# Patient Record
Sex: Female | Born: 1935 | Race: White | Hispanic: No | Smoking: Former smoker
Health system: Southern US, Community
[De-identification: ages and names within clinical notes are randomized; demographics above are authoritative.]

## PROBLEM LIST (undated history)

## (undated) DIAGNOSIS — I4891 Unspecified atrial fibrillation: Secondary | ICD-10-CM

## (undated) DIAGNOSIS — C50919 Malignant neoplasm of unspecified site of unspecified female breast: Secondary | ICD-10-CM

## (undated) DIAGNOSIS — J449 Chronic obstructive pulmonary disease, unspecified: Secondary | ICD-10-CM

## (undated) DIAGNOSIS — I219 Acute myocardial infarction, unspecified: Secondary | ICD-10-CM

## (undated) HISTORY — DX: Chronic obstructive pulmonary disease, unspecified: J44.9

## (undated) HISTORY — PX: BREAST SURGERY: SHX581

## (undated) HISTORY — DX: Acute myocardial infarction, unspecified: I21.9

## (undated) HISTORY — PX: ANGIOPLASTY: SHX39

## (undated) HISTORY — DX: Malignant neoplasm of unspecified site of unspecified female breast: C50.919

## (undated) HISTORY — PX: VAGINAL HYSTERECTOMY: SUR661

---

## 2004-09-29 ENCOUNTER — Ambulatory Visit: Payer: Self-pay | Admitting: Internal Medicine

## 2004-10-12 ENCOUNTER — Ambulatory Visit: Payer: Self-pay | Admitting: Internal Medicine

## 2004-11-05 ENCOUNTER — Ambulatory Visit: Payer: Self-pay | Admitting: General Surgery

## 2004-11-06 ENCOUNTER — Ambulatory Visit: Payer: Self-pay | Admitting: General Surgery

## 2004-11-13 ENCOUNTER — Ambulatory Visit: Payer: Self-pay | Admitting: General Surgery

## 2004-11-27 ENCOUNTER — Ambulatory Visit: Payer: Self-pay | Admitting: Oncology

## 2004-12-06 ENCOUNTER — Ambulatory Visit: Payer: Self-pay | Admitting: Oncology

## 2005-01-06 ENCOUNTER — Ambulatory Visit: Payer: Self-pay | Admitting: Oncology

## 2005-02-03 ENCOUNTER — Ambulatory Visit: Payer: Self-pay | Admitting: Oncology

## 2005-03-06 ENCOUNTER — Ambulatory Visit: Payer: Self-pay | Admitting: Oncology

## 2005-04-07 ENCOUNTER — Ambulatory Visit: Payer: Self-pay | Admitting: Oncology

## 2005-07-08 ENCOUNTER — Ambulatory Visit: Payer: Self-pay | Admitting: Oncology

## 2005-11-01 ENCOUNTER — Ambulatory Visit: Payer: Self-pay | Admitting: General Surgery

## 2006-01-04 ENCOUNTER — Ambulatory Visit: Payer: Self-pay | Admitting: Oncology

## 2006-09-22 ENCOUNTER — Ambulatory Visit: Payer: Self-pay | Admitting: Internal Medicine

## 2006-11-07 ENCOUNTER — Ambulatory Visit: Payer: Self-pay | Admitting: General Surgery

## 2006-11-09 ENCOUNTER — Ambulatory Visit: Payer: Self-pay | Admitting: General Surgery

## 2007-01-09 ENCOUNTER — Ambulatory Visit: Payer: Self-pay | Admitting: Oncology

## 2007-08-07 ENCOUNTER — Ambulatory Visit: Payer: Self-pay | Admitting: Oncology

## 2007-08-16 ENCOUNTER — Ambulatory Visit: Payer: Self-pay | Admitting: Oncology

## 2007-09-06 ENCOUNTER — Ambulatory Visit: Payer: Self-pay | Admitting: Oncology

## 2007-12-14 ENCOUNTER — Ambulatory Visit: Payer: Self-pay | Admitting: Oncology

## 2008-01-07 ENCOUNTER — Ambulatory Visit: Payer: Self-pay | Admitting: Radiation Oncology

## 2008-01-18 ENCOUNTER — Ambulatory Visit: Payer: Self-pay | Admitting: Radiation Oncology

## 2008-02-04 ENCOUNTER — Ambulatory Visit: Payer: Self-pay | Admitting: Radiation Oncology

## 2008-02-04 ENCOUNTER — Ambulatory Visit: Payer: Self-pay | Admitting: Oncology

## 2008-02-23 ENCOUNTER — Ambulatory Visit: Payer: Self-pay | Admitting: Family Medicine

## 2008-03-06 ENCOUNTER — Ambulatory Visit: Payer: Self-pay | Admitting: Radiation Oncology

## 2008-03-06 ENCOUNTER — Ambulatory Visit: Payer: Self-pay | Admitting: Oncology

## 2008-04-05 ENCOUNTER — Ambulatory Visit: Payer: Self-pay | Admitting: Oncology

## 2008-08-15 ENCOUNTER — Ambulatory Visit: Payer: Self-pay | Admitting: Oncology

## 2008-09-05 ENCOUNTER — Ambulatory Visit: Payer: Self-pay | Admitting: Oncology

## 2009-03-06 ENCOUNTER — Ambulatory Visit: Payer: Self-pay | Admitting: Oncology

## 2009-03-13 ENCOUNTER — Ambulatory Visit: Payer: Self-pay | Admitting: Oncology

## 2009-04-05 ENCOUNTER — Ambulatory Visit: Payer: Self-pay | Admitting: Oncology

## 2010-03-06 ENCOUNTER — Ambulatory Visit: Payer: Self-pay | Admitting: Radiation Oncology

## 2010-03-13 ENCOUNTER — Ambulatory Visit: Payer: Self-pay | Admitting: Radiation Oncology

## 2010-04-03 ENCOUNTER — Ambulatory Visit: Payer: Self-pay | Admitting: Internal Medicine

## 2010-04-05 ENCOUNTER — Ambulatory Visit: Payer: Self-pay | Admitting: Radiation Oncology

## 2010-04-06 ENCOUNTER — Ambulatory Visit: Payer: Self-pay | Admitting: Oncology

## 2010-05-06 ENCOUNTER — Ambulatory Visit: Payer: Self-pay | Admitting: Radiation Oncology

## 2010-10-06 ENCOUNTER — Ambulatory Visit: Payer: Self-pay | Admitting: Oncology

## 2010-10-07 LAB — CANCER ANTIGEN 27.29: CA 27.29: 38 U/mL (ref 0.0–38.6)

## 2010-11-05 ENCOUNTER — Ambulatory Visit: Payer: Self-pay | Admitting: Oncology

## 2011-12-25 ENCOUNTER — Emergency Department: Payer: Self-pay | Admitting: Emergency Medicine

## 2011-12-25 LAB — COMPREHENSIVE METABOLIC PANEL
Albumin: 3.5 g/dL (ref 3.4–5.0)
Alkaline Phosphatase: 61 U/L (ref 50–136)
Anion Gap: 8 (ref 7–16)
BUN: 20 mg/dL — ABNORMAL HIGH (ref 7–18)
Bilirubin,Total: 0.5 mg/dL (ref 0.2–1.0)
Chloride: 106 mmol/L (ref 98–107)
Creatinine: 0.75 mg/dL (ref 0.60–1.30)
Glucose: 124 mg/dL — ABNORMAL HIGH (ref 65–99)
SGOT(AST): 60 U/L — ABNORMAL HIGH (ref 15–37)
SGPT (ALT): 45 U/L
Sodium: 144 mmol/L (ref 136–145)
Total Protein: 7.3 g/dL (ref 6.4–8.2)

## 2011-12-25 LAB — CBC WITH DIFFERENTIAL/PLATELET
Basophil #: 0 10*3/uL (ref 0.0–0.1)
Basophil %: 0.7 %
HCT: 44.6 % (ref 35.0–47.0)
HGB: 15.1 g/dL (ref 12.0–16.0)
Lymphocyte %: 30.1 %
MCH: 33.1 pg (ref 26.0–34.0)
MCV: 98 fL (ref 80–100)
Monocyte %: 7.8 %
Neutrophil #: 4.1 10*3/uL (ref 1.4–6.5)
WBC: 6.7 10*3/uL (ref 3.6–11.0)

## 2011-12-25 LAB — PROTIME-INR
INR: 0.9
Prothrombin Time: 13 secs (ref 11.5–14.7)

## 2011-12-25 LAB — CK-MB: CK-MB: 6.4 ng/mL — ABNORMAL HIGH (ref 0.5–3.6)

## 2011-12-25 LAB — TROPONIN I: Troponin-I: 0.73 ng/mL — ABNORMAL HIGH

## 2014-03-15 LAB — BASIC METABOLIC PANEL
Anion Gap: 0 — ABNORMAL LOW (ref 7–16)
BUN: 29 mg/dL — AB (ref 7–18)
CREATININE: 1.23 mg/dL (ref 0.60–1.30)
Calcium, Total: 9.1 mg/dL (ref 8.5–10.1)
Chloride: 106 mmol/L (ref 98–107)
Co2: 35 mmol/L — ABNORMAL HIGH (ref 21–32)
EGFR (Non-African Amer.): 39 — ABNORMAL LOW
GFR CALC AF AMER: 46 — AB
Glucose: 104 mg/dL — ABNORMAL HIGH (ref 65–99)
OSMOLALITY: 287 (ref 275–301)
Potassium: 4.5 mmol/L (ref 3.5–5.1)
Sodium: 141 mmol/L (ref 136–145)

## 2014-03-15 LAB — CBC WITH DIFFERENTIAL/PLATELET
BASOS ABS: 0.1 10*3/uL (ref 0.0–0.1)
BASOS PCT: 1 %
Eosinophil #: 0.1 10*3/uL (ref 0.0–0.7)
Eosinophil %: 1.9 %
HCT: 47.8 % — ABNORMAL HIGH (ref 35.0–47.0)
HGB: 15 g/dL (ref 12.0–16.0)
LYMPHS ABS: 1.4 10*3/uL (ref 1.0–3.6)
Lymphocyte %: 20.3 %
MCH: 30.7 pg (ref 26.0–34.0)
MCHC: 31.4 g/dL — ABNORMAL LOW (ref 32.0–36.0)
MCV: 98 fL (ref 80–100)
MONO ABS: 0.8 x10 3/mm (ref 0.2–0.9)
Monocyte %: 11.9 %
Neutrophil #: 4.3 10*3/uL (ref 1.4–6.5)
Neutrophil %: 64.9 %
PLATELETS: 150 10*3/uL (ref 150–440)
RBC: 4.88 10*6/uL (ref 3.80–5.20)
RDW: 15.5 % — ABNORMAL HIGH (ref 11.5–14.5)
WBC: 6.7 10*3/uL (ref 3.6–11.0)

## 2014-03-15 LAB — TROPONIN I: Troponin-I: 0.26 ng/mL — ABNORMAL HIGH

## 2014-03-16 ENCOUNTER — Observation Stay: Payer: Self-pay | Admitting: Internal Medicine

## 2014-03-16 LAB — URINALYSIS, COMPLETE
Bacteria: NONE SEEN
Bilirubin,UR: NEGATIVE
Blood: NEGATIVE
GLUCOSE, UR: NEGATIVE mg/dL (ref 0–75)
Ketone: NEGATIVE
NITRITE: NEGATIVE
PH: 7 (ref 4.5–8.0)
PROTEIN: NEGATIVE
RBC,UR: 5 /HPF (ref 0–5)
Specific Gravity: 1.019 (ref 1.003–1.030)
Squamous Epithelial: 1
WBC UR: 8 /HPF (ref 0–5)

## 2014-03-16 LAB — TROPONIN I
TROPONIN-I: 0.26 ng/mL — AB
Troponin-I: 0.24 ng/mL — ABNORMAL HIGH

## 2014-03-16 LAB — CK-MB
CK-MB: 2.4 ng/mL (ref 0.5–3.6)
CK-MB: 2.4 ng/mL (ref 0.5–3.6)

## 2014-04-01 ENCOUNTER — Ambulatory Visit (INDEPENDENT_AMBULATORY_CARE_PROVIDER_SITE_OTHER): Payer: Medicare Other | Admitting: Podiatry

## 2014-04-01 ENCOUNTER — Encounter: Payer: Self-pay | Admitting: Podiatry

## 2014-04-01 VITALS — Resp 16 | Ht 64.0 in | Wt 129.0 lb

## 2014-04-01 DIAGNOSIS — B351 Tinea unguium: Secondary | ICD-10-CM

## 2014-04-01 DIAGNOSIS — M79609 Pain in unspecified limb: Secondary | ICD-10-CM

## 2014-04-01 NOTE — Progress Notes (Signed)
   Subjective:    Patient ID: Alexandria Roberts, female    DOB: 1924-02-26, 80 y.o.   MRN: [id number]  HPI Comments: She needs her toenails clipped. They do not hurt. They are thick and discolored. i trim my toenails.     Review of Systems  HENT:       Hearing loss Ear pain  Respiratory: Positive for cough, shortness of breath and wheezing.        Difficulty breathing  Musculoskeletal:       Difficulty walking  Skin:       Open sores  Neurological: Positive for dizziness, weakness and light-headedness.  Hematological: Bruises/bleeds easily.  Psychiatric/Behavioral: Positive for confusion. The patient is nervous/anxious.   All other systems reviewed and are negative.      Objective:   Physical Exam: I have reviewed her past history medications allergies surgeries social history and review of systems. Pulses remain barely palpable +1 over +4 dorsalis pedis pulse bilateral. Positive pitting edema to the bilateral lower extremity ulceration superficial left leg. Mild erythema to the foot. Neurologic sensorium appears to be intact. Deep tendon reflexes are intact and brisk bilateral. Neurologic sensorium slightly decreased per since once the monofilament. Muscle strength is 4/5 dorsiflexors plantar flexors inverters and evertors. Cutaneous evaluation demonstrates not only the increase in edema and pitting edema and superficial ulceration to the do not appear to be clinically infected she also thick yellow dystrophic with mycotic nails which are painful palpation. No open lesions to the plantar foot bilateral.        Assessment & Plan:  Assessment: Is pain in limb secondary to onychomycosis 1 through 5 bilateral.  Plan: Debridement of nails 1 through 5 bilateral covered service secondary to pain.

## 2014-07-01 ENCOUNTER — Ambulatory Visit: Payer: Medicare Other | Admitting: Podiatry

## 2014-07-05 ENCOUNTER — Ambulatory Visit: Payer: Medicare Other | Admitting: Podiatry

## 2014-12-03 ENCOUNTER — Encounter: Payer: Self-pay | Admitting: General Surgery

## 2014-12-06 ENCOUNTER — Encounter: Payer: Self-pay | Admitting: General Surgery

## 2014-12-23 ENCOUNTER — Ambulatory Visit (INDEPENDENT_AMBULATORY_CARE_PROVIDER_SITE_OTHER): Payer: Medicare Other | Admitting: Podiatry

## 2014-12-23 DIAGNOSIS — M79606 Pain in leg, unspecified: Secondary | ICD-10-CM

## 2014-12-23 DIAGNOSIS — B351 Tinea unguium: Secondary | ICD-10-CM

## 2014-12-23 NOTE — Progress Notes (Signed)
He presents today with chief complaint of painful elongated toenails 1 through 5 bilateral.  Objective: Vital signs are stable alert and oriented 3. Nails thick yellow dystrophic, mycotic and painful palpation.  Assessment: Pain in limb secondary onychomycosis 1 through 5 bilateral.  Plan: Debridement of nails 1 through 5 bilateral.

## 2014-12-25 LAB — WOUND AEROBIC CULTURE

## 2015-01-06 ENCOUNTER — Encounter: Payer: Self-pay | Admitting: General Surgery

## 2015-02-04 ENCOUNTER — Encounter
Admit: 2015-02-04 | Disposition: A | Discharge status: Home / Self Care | Payer: Self-pay | Attending: Cardiothoracic Surgery | Admitting: Cardiothoracic Surgery

## 2015-02-16 ENCOUNTER — Emergency Department: Payer: Self-pay | Admitting: Emergency Medicine

## 2015-03-07 ENCOUNTER — Encounter
Admit: 2015-03-07 | Disposition: A | Discharge status: Home / Self Care | Payer: Self-pay | Attending: Cardiothoracic Surgery | Admitting: Cardiothoracic Surgery

## 2015-03-24 ENCOUNTER — Ambulatory Visit (INDEPENDENT_AMBULATORY_CARE_PROVIDER_SITE_OTHER): Payer: Medicare Other | Admitting: Podiatry

## 2015-03-24 DIAGNOSIS — M79606 Pain in leg, unspecified: Secondary | ICD-10-CM | POA: Diagnosis not present

## 2015-03-24 DIAGNOSIS — B351 Tinea unguium: Secondary | ICD-10-CM

## 2015-03-24 NOTE — Progress Notes (Signed)
He presents today with chief complaint of painful elongated toenails 1 through 5 bilateral.  Objective: Vital signs are stable alert and oriented 3. Nails thick yellow dystrophic, mycotic and painful palpation.  Assessment: Pain in limb secondary onychomycosis 1 through 5 bilateral.  Plan: Debridement of nails 1 through 5 bilateral.

## 2015-03-29 NOTE — H&P (Signed)
PATIENT NAME:  Alexandria Roberts, Alexandria Roberts MR#:  [id number] DATE OF BIRTH:  11/07/1924  DATE OF ADMISSION:  03/15/2014  REFERRING PHYSICIAN:  Dr. Marjean Donna.   PRIMARY CARE PHYSICIAN:  Dr. Nicky Pugh.   CHIEF COMPLAINT:  Fall.   HISTORY OF PRESENT ILLNESS:  This is an 81 year old female with known past medical history of coronary artery disease, A. Fib, dementia, who lives at home by herself, son and daughter live next to her, help to take care of her, the patient presents with fall, it was unwitnessed fall, the patient has dementia, extremely poor historian.  She denies any loss of consciousness or dizziness, but she is known to have history of multiple falls in the past total more than eight over the last year due to unsteady balance, the patient's has dementia, extremely poor historian, but she did call her daughter after the fall, the patient reports she lost her balance.  The patient had basic work-up done in the ED, where she had cardiac enzymes done, where it was positive at 0.26, only troponin we have in the past on this patient was done two years ago where it was positive as well as at 0.73, she had cardiac cath done at [facility] then, where she was found to have coronary artery disease where she with recommendation of medical management with no stent insertion, the patient's CT head still pending, she denies any focal deficits, any tingling, any numbness, any slurred speech, any altered mental status.  As well, the patient had mild urinary tract infection.  She denies any cough, any productive sputum.   PAST MEDICAL HISTORY: 1.  Coronary artery disease.  2.  COPD.  3.  Breast cancer.  4.  Peptic ulcer disease.  5.  Osteoarthritis.  6.  A. Fib, not on anticoagulation.   PAST SURGICAL HISTORY: 1.  Hysterectomy.  2.  Hemorrhoidectomy.  3.  Left breast lumpectomy, left axillary node dissection in November 2005.   SOCIAL HISTORY:  The patient lives at home by herself.  She still smokes up to  half pack per day.  No alcohol.  No illicit drug use.  Family helps to take care of her.  Daughter lives next to her.   FAMILY HISTORY:  No family history of coronary artery disease at a young age.   ALLERGIES:  CODEINE.   HOME MEDICATIONS: 1.  Aspirin 81 mg oral daily.  2.  Lasix 20 mg oral daily.  3.  Keflex daily.  4.  Coreg oral, currently dose is unknown.  5.  Arimidex 1 mg oral daily.   REVIEW OF SYSTEMS: CONSTITUTIONAL:  The patient has dementia, mildly confused, but able to provide the review of systems.  She denies any fever, any chills, any fatigue, any weakness.  EYES:  Denies blurry vision, double vision, inflammation, glaucoma.  EARS, NOSE, THROAT:  Denies tinnitus, ear pain, hearing loss, epistaxis or discharge.  RESPIRATORY:  Denies cough, wheezing, hemoptysis, dyspnea.  Has history of COPD.  CARDIOVASCULAR:  Denies any chest pain, edema, palpitation, syncope.  Reports history of fall due to unsteady gait.  GASTROINTESTINAL:  Denies nausea, vomiting, diarrhea, abdominal pain, hematemesis, melena.  GENITOURINARY:  Denies dysuria, hematuria.  Family reporting polyuria and urinary incontinence.  ENDOCRINE:  Denies polyuria, polydipsia, heat or cold intolerance.  HEMATOLOGY:  Denies anemia, has easy bruising.  No bleeding diathesis. INTEGUMENTARY:  Denies acne, rash or skin lesion.  MUSCULOSKELETAL:  Denies any cramps, gout.  Reports history of arthritis.  NEUROLOGIC:  No history  of CVA, TIA, has history of dementia.  No focal deficits.  PSYCHIATRIC:  Denies anxiety, insomnia or depression.   PHYSICAL EXAMINATION: VITAL SIGNS:  Temperature 98.6, pulse 75, respiratory rate 16, blood pressure 92/68, saturating 99% on room air.  GENERAL:  Elderly female who looks comfortable in bed, in no apparent distress.  HEENT:  Head atraumatic, normocephalic.  Pupils equal, reactive to light.  Pink conjunctivae.  Anicteric sclerae.  Moist oral mucosa.  NECK:  Supple.  No thyromegaly.  No  JVD.  CHEST:  Good air entry bilaterally.  No wheezing, rales, rhonchi.  CARDIOVASCULAR:  S1, S2 heard.  No rubs, murmurs or gallops, irregularly irregular.  ABDOMEN:  Soft, nontender, nondistended.  Bowel sounds present.  EXTREMITIES:  Has edema +1 on the right, +2 on the left with chronic lower extremity discoloration as well has multiple skin wounds and ulcerations, mainly in the left lower extremity and oozing as well.  Radial and pedal pulses felt bilaterally.  PSYCHIATRIC:  Appropriate affect.  The patient appears to be confused, but pleasant, conversant.  She is aware of her name, however she is in the hospital, does not know which hospital, does not know the date.  NEUROLOGIC:  Cranial nerves grossly intact.  Motor five out of five.  extremities without significant deficits.  SKIN:  Has skin ulcers and wounds in the left lower extremity; as well has left ear injury from her fall with dried blood in the superior area.  MUSCULOSKELETAL:  No joint effusion or erythema.   PERTINENT LABORATORY DATA:  Glucose 104, BUN 29, creatinine 1.23, sodium 141, potassium 4.5, chloride 106, CO2 35.  Troponin 0.26.  White blood cells 6.7, hemoglobin 15, hematocrit 47.8, platelets 150.  Urinalysis, + 1 leukocyte esterase and 8 white blood cells.   ASSESSMENT AND PLAN: 1.  Fall, the patient is known to have history of multiple falls, unclear if this is related to presyncopal episodes as she is a very poor historian, but she denies any loss of consciousness, has multiple falls due to unsteady gait, we will consult physical therapy, we will check CT of the head, we will check echocardiogram.  We will monitor her on telemetry.  As well, she has urinary tract infection, might be contributing to this as well.  2.  Elevated troponin.  She denies any chest pain, any shortness of breath, but she is known to have history of carotid artery disease.  We will consult cardiology service.  We will obtain echocardiogram.  We will  admit her to telemetry, continue to cycle the enzymes.  We will give her one time dose 325 mg of aspirin and one dose of Lovenox treatment dose 1 mg/kg until we cycle her cardiac enzymes and until she is seen by cardiology.  3.  Atrial fibrillation, appears to be rate controlled at this point.  She is not an anticoagulation candidate due to her multiple falls.  We will hold her Coreg at this point due to her soft blood pressure.   4.  Tobacco abuse.  The patient was counseled.  5.  History of coronary artery disease.  Continue with aspirin.  Denies chest pain.  Denies shortness of breath.  6.  History of breast cancer.  Continue with Arimidex.  7.  Urinary tract infection.  Continue with Rocephin.  8.  History of dementia.  We will consult case management to see if she qualifies for any home care, displacement issue was discussed with the family and the patient is adamant  about not going to a nursing home and family are respecting her wishes.  So, so far we will consult case management to see if she qualifies for home care.  As well, they are doubtful if the patient will accept any home care, but they request [place] to try.  9. .  THE PATIENT DOES HAVE A LIVING WILL, DOES NOT HAVE HEALTHCARE POWER OF ATTORNEY AND SHE IS A DO NOT RESUSCITATE.  THIS WAS CONFIRMED BY THE FAMILY, DAUGHTER AND SON AT BEDSIDE.   Total time spent on admission and patient care 55 minutes.     ____________________________ Albertine Patricia, MD dse:ea D: 03/16/2014 02:01:29 ET T: 03/16/2014 03:20:03 ET JOB#: [id number]  cc: Albertine Patricia, MD, <Dictator> [place] Marian Graciela Husbands MD ELECTRONICALLY SIGNED 03/17/2014 2:43

## 2015-03-29 NOTE — Discharge Summary (Signed)
PATIENT NAME:  Alexandria Roberts, Alexandria Roberts MR#:  [id number] DATE OF BIRTH:  1924-07-12  DATE OF ADMISSION:  03/16/2014 DATE OF DISCHARGE:  03/16/2014  ADMISSION DIAGNOSES: Elevated troponin.  DISCHARGE DIAGNOSES:  1. Subarachnoid hemorrhage.  2. Elevated troponin secondary to subarachnoid hemorrhage and demand ischemia.  3. Atrial fibrillation.  4. Tobacco abuse. 5. History of coronary artery disease.  6. History of breast cancer.  7. Dementia. 8. Urinary tract infection.   CONSULTATIONS: None.  LABORATORY DATA: CT of the head showed acute subarachnoid hemorrhage within the right frontal and temporal regions most evident along the anterior right insular cortex, remote right PCA territory infarct.   HOSPITAL COURSE: An 81 year old female with a history of atrial fibrillation, on aspirin daily who presented with a fall. For further details, please refer to the H and P. 1. Subarachnoid hemorrhage. The patient presented with a fall. She sustained a subarachnoid hemorrhage on CT scan. The admitting physician as well as myself had a conversation with the patient as well as the family. The patient and family were well aware of the major implications of a subarachnoid hemorrhage including death and swelling of the brain. They did not want home health care. They do not want hospice. They wanted actually the patient to go home. They voiced clear understanding of this major life-threatening subarachnoid hemorrhage. She had no neurological at discharge. Initially physical therapy was consulted; however, our nurse walked with her and she was ambulating quite well without any deficits and the patient did not want to see a therapist. 2. Elevated troponin, likely from demand ischemia. Her troponins were 0.24 and stable. This is likely also secondary to her fall and subarachnoid hemorrhage. No events on telemetry. No need for echocardiogram and patient does not want any workup. 3. Atrial fibrillation. The patient's rate  was controlled. We stopped anticoagulation due to multiple falls and now she has a subarachnoid hemorrhage.  4. Tobacco abuse. The patient was counseled for 3 minutes stopping smoking especially in light of subarachnoid hemorrhage. 5. History of coronary artery disease. 6. History of breast cancer. The patient is to continue on outpatient medications.  7. Dementia. The patient did not want home health care. Her dementia is very mild. 8. Urinary tract infection. The patient was started on Rocephin and will be discharged with Keflex.  DISCHARGE MEDICATIONS: 1. Arimidex 1 mg daily. 2. Coreg daily. 3. Keflex 500 mg t.i.d. for 10 days.   The patient will stop taking aspirin or any NSAIDs or blood thinners.  DIET: Regular diet.  ACTIVITY: No exertion or heavy lifting.   DISCHARGE FOLLOWUP: The patient can follow up with her primary care physician in Dr. Brynda Greathouse in 1 to 2 days.   Again patient and family was made aware of her subarachnoid hemorrhage and risks of death. They did not want further workup, did not transfer to any tertiary care facility.  TIME SPENT ON THIS DISCHARGE: 50 minutes.   PROGNOSIS: The patient has guarded prognosis.   ____________________________ Donell Beers. Benjie Karvonen, MD spm:lt D: 03/16/2014 19:38:08 ET T: 03/17/2014 05:23:16 ET JOB#: [id number]  cc: Dmario Russom P. Benjie Karvonen, MD, <Dictator> Mikeal Hawthorne. Brynda Greathouse, MD Donell Beers Evo Aderman MD ELECTRONICALLY SIGNED 03/17/2014 14:12

## 2015-03-29 NOTE — H&P (Signed)
PATIENT NAME:  Alexandria, Roberts MR#:  [id number] DATE OF BIRTH:  02/25/1924  DATE OF ADMISSION:  03/16/2014  ADDENDUM  The patient's CT head without contrast was done and it does show acute subarachnoid hemorrhage within right frontal and temporal regions, most evident along the anterior right insular cortex, this was discussed with the patient's son and daughter at the bedside, as well the patient was present, as it was briefly discussed by ED physician with the family, Dr. Marjean Donna, family are aware there is no neurosurgery service available at this hospital, they do not wish for any intervention or further work-up for her subarachnoid hemorrhage, and they do understand that and at the point she has any further neurological deterioration, they wished to proceed with comfort care, and they do understand the subarachnoid hemorrhage might expand and deteriorate and there is no neurosurgery over here.     ____________________________ Albertine Patricia, MD dse:ea D: 03/16/2014 02:54:42 ET T: 03/16/2014 04:07:29 ET JOB#: [id number]  cc: Albertine Patricia, MD, <Dictator> DAWOOD Graciela Husbands MD ELECTRONICALLY SIGNED 03/17/2014 2:43

## 2015-04-03 ENCOUNTER — Ambulatory Visit
Admit: 2015-04-03 | Disposition: A | Discharge status: Home / Self Care | Payer: Self-pay | Attending: Vascular Surgery | Admitting: Vascular Surgery

## 2015-04-03 LAB — BUN: BUN: 18 mg/dL

## 2015-04-03 LAB — CREATININE, SERUM
Creatinine: 0.95 mg/dL
EGFR (African American): >60
GFR CALC NON AF AMER: 53 — AB

## 2015-04-06 NOTE — Op Note (Signed)
PATIENT NAME:  Alexandria Roberts, Alexandria Roberts MR#:  [id number] DATE OF BIRTH:  01/02/1924  DATE OF PROCEDURE:  04/03/2015  PREOPERATIVE DIAGNOSES:  1.  Peripheral arterial disease with ulceration, left lower extremity.  2.  Dementia.  3.  Atrial fibrillation.  4.  History of myocardial infarction.  5.  Rheumatoid arthritis.  6.  History of breast cancer.   POSTOPERATIVE DIAGNOSES:  1.  Peripheral arterial disease with ulceration, left lower extremity.  2.  Dementia.  3.  Atrial fibrillation.  4.  History of myocardial infarction.  5.  Rheumatoid arthritis.  6.  History of breast cancer.  PROCEDURES:  1.  Ultrasound guidance for vascular access to right femoral artery.  2.  Catheter placement to left anterior tibial and left peroneal arteries from right femoral approach. 3.  Aortogram and selective left lower extremity angiogram.  4.  Percutaneous transluminal angioplasty of left anterior tibial artery with 3 mm diameter angioplasty balloon.  5.  Percutaneous transluminal angioplasty of left tibioperoneal trunk and peroneal artery with 3 mm diameter angioplasty.  6.  Percutaneous transluminal angioplasty of above-knee popliteal artery and entire superficial femoral artery with 5 mm diameter angioplasty balloon.  7.  Viabahn covered stent placement to the superficial femoral artery for residual stenosis and thrombus after angioplasty with a 6 mm diameter x 25 cm length Viabahn stent.  8.  StarClose closure device, right femoral artery.   SURGEON: Algernon Huxley, MD   ANESTHESIA: Local with moderate conscious sedation.   ESTIMATED BLOOD LOSS: Minimal.   INDICATION FOR PROCEDURE: This is an 81 year old female who I saw in the office earlier this year. She had SFA occlusion and severely reduced perfusion bilaterally but had ulcerations on the left leg that were not healing. The family continued local wound care for about 2 months, but when it was clear these wounds were not going to heal without adequate  perfusion, they contacted our office for angiography for further evaluation and potential treatment. Risks and benefits were discussed. Informed consent was obtained.   DESCRIPTION OF PROCEDURE: The patient is brought to the vascular suite. Groins were shaved and prepped, and a sterile surgical field was created. The right femoral artery is visualized on ultrasound and found to be widely patent. It was then accessed under direct ultrasound guidance without difficulty with a Seldinger needle and a permanent image was recorded. A J-wire and 5 French sheath were placed. Pigtail catheter was placed in artery at the L1-L2 level and AP aortogram was performed. This showed what appeared to be normal flow in the renal arteries. The aorta and iliac segments were widely patent. I then crossed the aortic bifurcation and advanced to the left femoral head. Selective left lower extremity angiogram was then performed. This showed occlusion of the superficial femoral artery 2-3 cm beyond its origin with reconstitution just below Hunter canal. The popliteal artery had some stenosis below this. There was then abrupt occlusions of the proximal anterior tibial artery, tibioperoneal trunk, and proximal posterior tibial and peroneal arteries. This could have been from native disease, but did have the appearance of possible embolization to the area. The patient was heparinized. A 6 French Ansell sheath was placed over a Terumo advantage wire. I then navigated through the SFA occlusion with minimal difficulty with the Terumo advantage wire and a Kumpe catheter. The Kumpe catheter was actually taken out the proximal anterior tibial artery and imaging was performed there. We then removed the Kumpe catheter and placed an 0.018 Advantage wire. I then  navigated into the tibioperoneal trunk and peroneal artery, having exchanged for a NaviCross and then an Usher catheter, confirming intraluminal flow in the peroneal artery. We had already  confirmed intraluminal flow in the anterior tibial artery; both vessels were good below the occlusion. At this point, I elected to proceed with intervention. I used a 3 mm diameter angioplasty balloon in the tibioperoneal trunk and peroneal arteries for the first 10-12 cm the peroneal artery. The balloon was then deflated. I removed the wire and placed it into the anterior tibial artery crossing the occlusion. The anterior tibial artery was treated over its first 10-12 cm as well with the 3 mm diameter angioplasty balloon. Following this, I treated the popliteal and SFA with a 5 mm diameter angioplasty balloon; two inflations were required even with a long balloon as it was inflated from just above the knee to the common femoral artery. Narrowing and waste was seen in multiple locations with angioplasty in the SFA and popliteal artery. Completion angiogram after this showed a large amount of thrombus in the mid superficial femoral artery up to the proximal superficial femoral artery. There was now flow channel. The popliteal angioplasty remained widely patent. I elected to cover the majority of the SFA with a Viabahn covered stent to treat the thrombosis. A 6 mm diameter x 25 cm length Viabahn stent was deployed until about 3 cm below the femoral bifurcation and origin of the superficial femoral artery. This was to ensure that we did not encroach upon the profunda femoris artery and hurt the runoff there. This was post dilated with a 5 mm balloon, although there was some significant residual narrowing in the midsegment that required a 6 mm diameter high-pressure balloon to open. Completion angiogram following this showed brisk flow through the stent. There was about a 20% residual stenosis at worst within the stent. The popliteal artery itself was patent. At this point, we had cleared the stenosis and what was likely thrombus in the tibioperoneal trunk, but peroneal artery occluded downstream; however, with  clearance of the tibioperoneal trunk, the posterior tibial artery was now large and patent into the foot. As well, the anterior tibial artery intervention resulted in in-line flow to the foot, although there was some residual thrombus remaining that was not occlusive in the anterior tibial artery. At this point, she had 1-1/2 to 2 vessel runoff to the foot. The SFA was patent. I felt we had done all we could do from a percutaneous standpoint and had markedly improved her perfusion. The sheath was removed. StarClose closure device was deployed in the usual fashion with excellent hemostatic result. The patient tolerated the procedure well and was taken to the recovery room in stable condition.    ____________________________ Algernon Huxley, MD jsd:bm D: 04/03/2015 17:45:04 ET T: 04/04/2015 00:09:06 ET JOB#: [id number]  cc: Algernon Huxley, MD, <Dictator> Algernon Huxley MD ELECTRONICALLY SIGNED 04/04/2015 17:29

## 2015-04-07 ENCOUNTER — Inpatient Hospital Stay: Payer: Medicare Other

## 2015-04-07 ENCOUNTER — Inpatient Hospital Stay
Admission: EM | Admit: 2015-04-07 | Discharge: 2015-04-12 | DRG: 603 | Disposition: A | Discharge status: Skilled Nursing Facility | Payer: Medicare Other | Attending: Internal Medicine | Admitting: Internal Medicine

## 2015-04-07 ENCOUNTER — Encounter: Payer: Self-pay | Admitting: *Deleted

## 2015-04-07 DIAGNOSIS — Z79899 Other long term (current) drug therapy: Secondary | ICD-10-CM | POA: Diagnosis not present

## 2015-04-07 DIAGNOSIS — R0989 Other specified symptoms and signs involving the circulatory and respiratory systems: Secondary | ICD-10-CM

## 2015-04-07 DIAGNOSIS — Z7902 Long term (current) use of antithrombotics/antiplatelets: Secondary | ICD-10-CM

## 2015-04-07 DIAGNOSIS — K5900 Constipation, unspecified: Secondary | ICD-10-CM | POA: Diagnosis present

## 2015-04-07 DIAGNOSIS — I4891 Unspecified atrial fibrillation: Secondary | ICD-10-CM | POA: Diagnosis present

## 2015-04-07 DIAGNOSIS — J449 Chronic obstructive pulmonary disease, unspecified: Secondary | ICD-10-CM | POA: Diagnosis present

## 2015-04-07 DIAGNOSIS — Z886 Allergy status to analgesic agent status: Secondary | ICD-10-CM

## 2015-04-07 DIAGNOSIS — F17290 Nicotine dependence, other tobacco product, uncomplicated: Secondary | ICD-10-CM | POA: Diagnosis present

## 2015-04-07 DIAGNOSIS — N3090 Cystitis, unspecified without hematuria: Secondary | ICD-10-CM | POA: Diagnosis present

## 2015-04-07 DIAGNOSIS — F0150 Vascular dementia without behavioral disturbance: Secondary | ICD-10-CM | POA: Diagnosis present

## 2015-04-07 DIAGNOSIS — I739 Peripheral vascular disease, unspecified: Secondary | ICD-10-CM | POA: Diagnosis present

## 2015-04-07 DIAGNOSIS — R443 Hallucinations, unspecified: Secondary | ICD-10-CM | POA: Diagnosis present

## 2015-04-07 DIAGNOSIS — Z90710 Acquired absence of both cervix and uterus: Secondary | ICD-10-CM | POA: Diagnosis not present

## 2015-04-07 DIAGNOSIS — I517 Cardiomegaly: Secondary | ICD-10-CM | POA: Diagnosis present

## 2015-04-07 DIAGNOSIS — E8770 Fluid overload, unspecified: Secondary | ICD-10-CM | POA: Diagnosis present

## 2015-04-07 DIAGNOSIS — I2510 Atherosclerotic heart disease of native coronary artery without angina pectoris: Secondary | ICD-10-CM | POA: Diagnosis present

## 2015-04-07 DIAGNOSIS — Z853 Personal history of malignant neoplasm of breast: Secondary | ICD-10-CM | POA: Diagnosis not present

## 2015-04-07 DIAGNOSIS — I959 Hypotension, unspecified: Secondary | ICD-10-CM | POA: Diagnosis present

## 2015-04-07 DIAGNOSIS — I252 Old myocardial infarction: Secondary | ICD-10-CM

## 2015-04-07 DIAGNOSIS — Z882 Allergy status to sulfonamides status: Secondary | ICD-10-CM

## 2015-04-07 DIAGNOSIS — L0390 Cellulitis, unspecified: Secondary | ICD-10-CM | POA: Diagnosis present

## 2015-04-07 DIAGNOSIS — M7120 Synovial cyst of popliteal space [Baker], unspecified knee: Secondary | ICD-10-CM | POA: Diagnosis present

## 2015-04-07 DIAGNOSIS — Z66 Do not resuscitate: Secondary | ICD-10-CM | POA: Diagnosis present

## 2015-04-07 DIAGNOSIS — Z86718 Personal history of other venous thrombosis and embolism: Secondary | ICD-10-CM

## 2015-04-07 DIAGNOSIS — I10 Essential (primary) hypertension: Secondary | ICD-10-CM | POA: Diagnosis present

## 2015-04-07 DIAGNOSIS — R609 Edema, unspecified: Secondary | ICD-10-CM | POA: Diagnosis not present

## 2015-04-07 DIAGNOSIS — E876 Hypokalemia: Secondary | ICD-10-CM | POA: Diagnosis present

## 2015-04-07 DIAGNOSIS — L03116 Cellulitis of left lower limb: Secondary | ICD-10-CM | POA: Diagnosis present

## 2015-04-07 HISTORY — DX: Unspecified atrial fibrillation: I48.91

## 2015-04-07 LAB — URINALYSIS COMPLETE WITH MICROSCOPIC (ARMC ONLY)
Bilirubin Urine: NEGATIVE
GLUCOSE, UA: NEGATIVE mg/dL
KETONES UR: NEGATIVE mg/dL
NITRITE: NEGATIVE
PROTEIN: NEGATIVE mg/dL
SPECIFIC GRAVITY, URINE: 1.015 (ref 1.005–1.030)
pH: 5 (ref 5.0–8.0)

## 2015-04-07 LAB — CBC WITH DIFFERENTIAL/PLATELET
Basophils Absolute: 0.1 10*3/uL (ref 0–0.1)
Basophils Relative: 1 %
Eosinophils Absolute: 0 10*3/uL (ref 0–0.7)
Eosinophils Relative: 0 %
HCT: 42.6 % (ref 35.0–47.0)
Hemoglobin: 14.2 g/dL (ref 12.0–16.0)
Lymphs Abs: 0.8 10*3/uL — ABNORMAL LOW (ref 1.0–3.6)
MCH: 32.2 pg (ref 26.0–34.0)
MCHC: 33.3 g/dL (ref 32.0–36.0)
MCV: 96.8 fL (ref 80.0–100.0)
Monocytes Absolute: 1.5 10*3/uL — ABNORMAL HIGH (ref 0.2–0.9)
Monocytes Relative: 17 %
Neutro Abs: 6.8 10*3/uL — ABNORMAL HIGH (ref 1.4–6.5)
PLATELETS: 168 10*3/uL (ref 150–440)
RBC: 4.4 MIL/uL (ref 3.80–5.20)
RDW: 13.9 % (ref 11.5–14.5)
WBC: 9.2 10*3/uL (ref 3.6–11.0)

## 2015-04-07 LAB — COMPREHENSIVE METABOLIC PANEL
ALBUMIN: 3.1 g/dL — AB (ref 3.5–5.0)
ALK PHOS: 91 U/L (ref 38–126)
ALT: 22 U/L (ref 14–54)
ANION GAP: 8 (ref 5–15)
AST: 38 U/L (ref 15–41)
BILIRUBIN TOTAL: 0.9 mg/dL (ref 0.3–1.2)
BUN: 18 mg/dL (ref 6–20)
CALCIUM: 8.6 mg/dL — AB (ref 8.9–10.3)
CHLORIDE: 101 mmol/L (ref 101–111)
CO2: 29 mmol/L (ref 22–32)
CREATININE: 1.03 mg/dL — AB (ref 0.44–1.00)
GFR calc Af Amer: 55 mL/min — ABNORMAL LOW (ref >60)
GFR calc non Af Amer: 47 mL/min — ABNORMAL LOW (ref >60)
GLUCOSE: 121 mg/dL — AB (ref 65–99)
Potassium: 4.4 mmol/L (ref 3.5–5.1)
SODIUM: 138 mmol/L (ref 135–145)
Total Protein: 7.4 g/dL (ref 6.5–8.1)

## 2015-04-07 LAB — CBC
HEMATOCRIT: 43.2 % (ref 35.0–47.0)
HEMOGLOBIN: 14.1 g/dL (ref 12.0–16.0)
MCH: 31.7 pg (ref 26.0–34.0)
MCHC: 32.7 g/dL (ref 32.0–36.0)
MCV: 97 fL (ref 80.0–100.0)
PLATELETS: 165 10*3/uL (ref 150–440)
RBC: 4.46 MIL/uL (ref 3.80–5.20)
RDW: 13.9 % (ref 11.5–14.5)
WBC: 9.5 10*3/uL (ref 3.6–11.0)

## 2015-04-07 LAB — CREATININE, SERUM
CREATININE: 0.95 mg/dL (ref 0.44–1.00)
GFR calc Af Amer: >60 mL/min (ref >60)
GFR, EST NON AFRICAN AMERICAN: 52 mL/min — AB (ref >60)

## 2015-04-07 LAB — CK TOTAL AND CKMB (NOT AT ARMC)
CK, MB: 1.8 ng/mL (ref 0.5–5.0)
Relative Index: INVALID (ref 0.0–2.5)
Total CK: 27 U/L — ABNORMAL LOW (ref 38–234)

## 2015-04-07 LAB — TROPONIN I
TROPONIN I: 0.06 ng/mL — AB (ref <0.031)
Troponin I: 0.04 ng/mL — ABNORMAL HIGH (ref <0.031)
Troponin I: 0.04 ng/mL — ABNORMAL HIGH (ref <0.031)

## 2015-04-07 MED ORDER — ACETAMINOPHEN 325 MG PO TABS
650.0000 mg | ORAL_TABLET | Freq: Four times a day (QID) | ORAL | Status: DC | PRN
  Administered 2015-04-07 – 2015-04-10 (×3): 650 mg via ORAL
  Filled 2015-04-07 (×3): qty 2

## 2015-04-07 MED ORDER — SENNOSIDES-DOCUSATE SODIUM 8.6-50 MG PO TABS: 1.0000 | ORAL_TABLET | Freq: Every evening | ORAL | Status: DC | PRN

## 2015-04-07 MED ORDER — CLOPIDOGREL BISULFATE 75 MG PO TABS
75.0000 mg | ORAL_TABLET | Freq: Every day | ORAL | Status: DC
  Administered 2015-04-07 – 2015-04-12 (×4): 75 mg via ORAL
  Filled 2015-04-07 (×7): qty 1

## 2015-04-07 MED ORDER — VANCOMYCIN HCL IN DEXTROSE 1-5 GM/200ML-% IV SOLN
1000.0000 mg | Freq: Once | INTRAVENOUS | Status: AC
  Administered 2015-04-07: 1000 mg via INTRAVENOUS

## 2015-04-07 MED ORDER — SODIUM CHLORIDE 0.9 % IV SOLN
Freq: Once | INTRAVENOUS | Status: AC
Start: 2015-04-07 — End: 2015-04-07
  Administered 2015-04-07: 13:00:00 via INTRAVENOUS

## 2015-04-07 MED ORDER — DEXTROSE 5 % IV SOLN
INTRAVENOUS | Status: AC
  Filled 2015-04-07: qty 10

## 2015-04-07 MED ORDER — ALUM & MAG HYDROXIDE-SIMETH 200-200-20 MG/5ML PO SUSP: 30.0000 mL | Freq: Four times a day (QID) | ORAL | Status: DC | PRN

## 2015-04-07 MED ORDER — PIPERACILLIN-TAZOBACTAM 3.375 G IVPB 30 MIN
3.3750 g | Freq: Four times a day (QID) | INTRAVENOUS | Status: DC
  Filled 2015-04-07 (×4): qty 50

## 2015-04-07 MED ORDER — VANCOMYCIN HCL IN DEXTROSE 750-5 MG/150ML-% IV SOLN
750.0000 mg | INTRAVENOUS | Status: DC
  Administered 2015-04-07 – 2015-04-10 (×3): 750 mg via INTRAVENOUS
  Filled 2015-04-07 (×5): qty 150

## 2015-04-07 MED ORDER — VANCOMYCIN HCL IN DEXTROSE 1-5 GM/200ML-% IV SOLN
INTRAVENOUS | Status: AC
  Administered 2015-04-07: 1000 mg via INTRAVENOUS
  Filled 2015-04-07: qty 200

## 2015-04-07 MED ORDER — PIPERACILLIN-TAZOBACTAM 3.375 G IVPB 30 MIN
3.3750 g | Freq: Three times a day (TID) | INTRAVENOUS | Status: DC
Start: 2015-04-07 — End: 2015-04-11
  Administered 2015-04-07 – 2015-04-11 (×12): 3.375 g via INTRAVENOUS
  Filled 2015-04-07 (×16): qty 50

## 2015-04-07 MED ORDER — ACETAMINOPHEN 650 MG RE SUPP: 650.0000 mg | Freq: Four times a day (QID) | RECTAL | Status: DC | PRN

## 2015-04-07 MED ORDER — DEXTROSE 5 % IV SOLN: 1.0000 g | Freq: Once | INTRAVENOUS | Status: DC

## 2015-04-07 MED ORDER — HEPARIN SODIUM (PORCINE) 5000 UNIT/ML IJ SOLN
5000.0000 [IU] | Freq: Three times a day (TID) | INTRAMUSCULAR | Status: DC
  Administered 2015-04-07 – 2015-04-12 (×15): 5000 [IU] via SUBCUTANEOUS
  Filled 2015-04-07 (×15): qty 1

## 2015-04-07 NOTE — ED Notes (Signed)
Pt presents from home w/ c/o left lower extremity pain x3-4 days, pt had a vascular procedure done last Thursday, pt unable to recall what kind of procedure was performed. Pt alert on arrival.

## 2015-04-07 NOTE — Progress Notes (Signed)
ANTIBIOTIC CONSULT NOTE - INITIAL  Pharmacy Consult for Alexandria Roberts Indication: cellulitis  Allergies  Allergen Reactions  . Codeine Other (See Comments)    Reaction:  Unknown  . Sulfa Antibiotics Other (See Comments)    Reaction:  Unknown    Patient Measurements: Height: 5\' 4"  (162.6 cm) Weight: 147 lb 12.8 oz (67.042 kg) IBW/kg (Calculated) : 54.7  Vital Signs: Temp: 99.1 F (37.3 C) (05/02 1610) Temp Source: Oral (05/02 1610) BP: 96/58 mmHg (05/02 1614) Pulse Rate: 73 (05/02 1610) Intake/Output from previous day:   Intake/Output from this shift: Total I/O In: 200 [I.V.:200] Out: 100 [Urine:100]  Labs:  Recent Labs  04/07/15 1121  WBC 9.2  HGB 14.2  PLT 168  CREATININE 1.03*   Estimated Creatinine Clearance: 35.5 mL/min (by C-G formula based on Cr of 1.03). No results for input(s): VANCOTROUGH, VANCOPEAK, VANCORANDOM, GENTTROUGH, GENTPEAK, GENTRANDOM, TOBRATROUGH, TOBRAPEAK, TOBRARND, AMIKACINPEAK, AMIKACINTROU, AMIKACIN in the last 72 hours.   Microbiology: No results found for this or any previous visit (from the past 720 hour(s)).  Medical History: Past Medical History  Diagnosis Date  . COPD (chronic obstructive pulmonary disease)   . Heart attack   . Breast cancer   . Atrial fibrillation     Medications:  Anti-infectives    Start     Dose/Rate Route Frequency Ordered Stop   04/07/15 2230  vancomycin (VANCOCIN) IVPB 750 mg/150 ml premix     750 mg 150 mL/hr over 60 Minutes Intravenous Every 24 hours 04/07/15 1653     04/07/15 1800  piperacillin-tazobactam (ZOSYN) IVPB 3.375 g  Status:  Discontinued     3.375 g 100 mL/hr over 30 Minutes Intravenous 4 times per day 04/07/15 1611 04/07/15 1704   04/07/15 1715  piperacillin-tazobactam (ZOSYN) IVPB 3.375 g     3.375 g 12.5 mL/hr over 240 Minutes Intravenous 3 times per day 04/07/15 1704     04/07/15 1345  vancomycin (VANCOCIN) IVPB 1000 mg/200 mL premix     1,000 mg 200 mL/hr over 60 Minutes  Intravenous  Once 04/07/15 1330 04/07/15 1516   04/07/15 1330  cefTRIAXone (ROCEPHIN) 1 g in dextrose 5 % 50 mL IVPB  Status:  Discontinued     1 g 100 mL/hr over 30 Minutes Intravenous  Once 04/07/15 1330 04/07/15 1448     Assessment: Empiric abx ordered for cellulitis.    Goal of Therapy:  Vancomycin trough level 10-15 mcg/ml  Plan:  Zosyn 3.375 g EI q 8 h ordered. Vancomycin 1000 mg iv once given. Will begin 750 mg iv q 24 h with stacked dosing. Will check a trough with the 4th dose.   [name] 04/07/2015,5:06 PM

## 2015-04-07 NOTE — H&P (Signed)
Patient Demographics  Alexandria Roberts, is a 81 y.o. female  MRN: [id number]   DOB - December 11, 1923  Admit Date - 04/07/2015  Outpatient Primary MD for the patient is Marden Noble, MD     Chief Complaint  Patient presents with  . Leg Pain     HPI  Alexandria Roberts  is a 81 y.o. female with past medical history is significant for PAD with recent stent and angioplasty by Dr. do last Thursday, CAD, essential hypertension, breast cancer. History of present illness is taken from the son who is at bedside. Patient presented to Dr. Ozella Almond office last Thursday for angioplasty and stent placement since that time patient's been fatigued and weak and not in her usual state of health. Family noticed her left leg swollen tender and hot since yesterday. Eyes any fevers or chills. Patient presents today with left lower extremity cellulitis. It is noted the patient has an ulcer at the back of her left shin. According to the son is at bedside patient is also been complaining of chest pain for the past 2 days. She does have a history of CAD.  Review of Systems   Due to dementia patient  cannot provide a full review of systems.   Social History History  Substance Use Topics  . Smoking status: Current Every Day Smoker  . Smokeless tobacco: Not on file  . Alcohol Use: No      Past Medical History  Diagnosis Date  . COPD (chronic obstructive pulmonary disease)   . Heart attack   . Breast cancer   . Atrial fibrillation    SURGICAL History: Hysterectomy Partial Masectomy   Family History History reviewed. No pertinent family history.   Prior to Admission medications   Medication Sig Start Date End Date Taking? Authorizing Provider  carvedilol (COREG) 3.125 MG tablet Take 3.125 mg by mouth every morning.    Yes Historical  Provider, MD  clopidogrel (PLAVIX) 75 MG tablet Take 75 mg by mouth every morning.   Yes Historical Provider, MD  KLOR-CON M20 20 MEQ tablet Take 20 mEq by mouth every morning.  03/08/14  Yes Historical Provider, MD  Multiple Vitamins-Minerals (MULTIVITAMIN ADULT PO) Take 1 tablet by mouth every morning.   Yes Historical Provider, MD  torsemide (DEMADEX) 20 MG tablet Take 20 mg by mouth every morning.  03/08/14  Yes Historical Provider, MD  levofloxacin (LEVAQUIN) 500 MG tablet Take 500 mg by mouth daily. 03/29/15   Historical Provider, MD    Allergies  Allergen Reactions  . Codeine Other (See Comments)    Reaction:  Unknown  . Sulfa Antibiotics Other (See Comments)    Reaction:  Unknown    Physical Exam  Vitals  Blood pressure 106/81, pulse 32, temperature 98.8 F (37.1 C), temperature source Oral, resp. rate 28, SpO2 92 %.   Constitutional:  Well-developed and well-nourished. No distress.  HENT:  Head: Normocephalic and atraumatic.  Mouth/Throat:  Oropharynx is clear and moist.  Eyes: Pupils are equal, round, and reactive to light.  Neck: Normal range of motion. Neck supple. No JVD present. No tracheal deviation present. No thyromegaly present.  Cardiovascular: Normal rate, regular rhythm and normal heart sounds.  Exam reveals no gallop.   2/6 SE murmur heard. Respiratory: Effort normal and breath sounds normal. No respiratory distress. No wheezing, crackles, rales. GI: Soft. No distension. There is no tenderness. There is no rebound and no guarding.  Musculoskeletal:No edema or tenderness.  Neurological: Alert No cranial nerve deficit. Coordination normal.  Skin: Left shin is  Erythematous, tender, and warm to touch. She has a 2 x 2 small ulcer noted at the bottom of her shin. It is also noted she has abrasions over the elbows.Marland Kitchen  Psychiatric: Dementia  Data Review  CBC  Recent Labs Lab 04/07/15 1121  WBC 9.2  HGB 14.2  HCT 42.6  PLT 168  MCV 96.8  MCH 32.2  MCHC 33.3   RDW 13.9  LYMPHSABS 0.8*  MONOABS 1.5*  EOSABS 0.0  BASOSABS 0.1   ------------------------------------------------------------------------------------------------------------------  Chemistries   Recent Labs Lab 04/03/15 1302 04/07/15 1121  NA  --  138  K  --  4.4  CL  --  101  CO2  --  29  GLUCOSE  --  121*  BUN 18 18  CREATININE 0.95 1.03*  CALCIUM  --  8.6*  AST  --  38  ALT  --  22  ALKPHOS  --  91  BILITOT  --  0.9   ------------------------------------------------------------------------------------------------------------------ CrCl cannot be calculated (Unknown ideal weight.). ------------------------------------------------------------------------------------------------------------------   Coagulation profile No results for input(s): INR, PROTIME in the last 168 hours. ------------------------------------------------------------------------------------------------------------------- -------------------------------------------------------------------------------------------------------------------  Cardiac Enzymes  Recent Labs Lab 04/07/15 1121  TROPONINI 0.06*   ------------------------------------------------------------------------------------------------------------------ Invalid input(s): POCBNP   ---------------------------------------------------------------------------------------------------------------  Urinalysis    Component Value Date/Time   COLORURINE YELLOW* 04/07/2015 1221   APPEARANCEUR CLOUDY* 04/07/2015 1221   LABSPEC 1.015 04/07/2015 1221   PHURINE 5.0 04/07/2015 1221   GLUCOSEU NEGATIVE 04/07/2015 1221   HGBUR 1+* 04/07/2015 1221   BILIRUBINUR NEGATIVE 04/07/2015 1221   KETONESUR NEGATIVE 04/07/2015 1221   PROTEINUR NEGATIVE 04/07/2015 1221   NITRITE NEGATIVE 04/07/2015 1221   LEUKOCYTESUR 3+* 04/07/2015 1221     ----------------------------------------------------------------------------------------------------------------  Imaging results:    EKG pending   Assessment & Plan  1. Left lower extremely cellulitis: Patient will be admitted to medical surgical services. We'll consult Dr. do as the patient recently had a angioplasty. Patient will be placed on broad-spectrum antibiotics including vancomycin and Zosyn. Blood cultures were ordered in the emergency department. I will order a lower extremity Doppler to rule out a DVT. I will also consult Dr. do and wound care. She has a left wound at the bottom of her left shin. 2. Chest pain: Patient is complaining of chest pain according to the family members. We will continue to monitor troponins. I will order an EKG. She is on Plavix which we will continue. It is noted that her troponin is 0.06 we will obtain 2 more sets of troponins and CPK. 3. Cystitis without hematuria patient's currently on Zosyn which we'll continue urine culture was ordered in the emergency department which I'll follow up on. 4. Essential hypertension: We will hold blood pressure medications as a blood pressure is low normal. We'll need to follow carefully.    DVT Prophylaxis Heparin  SCDs     Family Communication: Admission, patients condition and plan  of care including tests being ordered have been discussed with the patient and family who indicate understanding and agree with the plan and Code Status.  Code Status DO NOT RESUSCITATE   Time spent in minutes : 31   Dacota Ruben, MD

## 2015-04-07 NOTE — Progress Notes (Signed)
Called Dr. Benjie Karvonen and reported troponin level of 0.04. No new orders given.

## 2015-04-07 NOTE — ED Provider Notes (Signed)
[facility] Emergency Department Provider Note  ____________________________________________  Time seen:----------------------------------------- 12:09 PM on 04/07/2015 -----------------------------------------    I have reviewed the triage vital signs and the nursing notes.   HISTORY  Chief Complaint Leg Pain       HPI Aly Seidenberg is a 81 y.o. female family reports she had a vascular procedure with stenting of both legs on this past Thursday legs have gotten gradually more swollen since then and patient has become weak for the last 2 days yesterday she had trouble getting up and today she could not stand and her family reports that it was like trying to sit a block of wood up on wet noodles patient reports she has had tingling in the right leg since day after surgery is very mild that she is not terribly upset by that family reports that her left leg has become increasingly red and swollen especially over the last 2 days patient denies any fever nausea vomiting diarrhea coughing or any other complaints patient does not complain of any pain in the left leg nothing seems influence of pain in the right leg or the swelling in either leg     Past Medical History  Diagnosis Date  . COPD (chronic obstructive pulmonary disease)   . Heart attack   . Breast cancer   . Atrial fibrillation    past medical history includes dementia and congestive heart failure  There are no active problems to display for this patient.   Past Surgical History  Procedure Laterality Date  . Breast surgery Left   . Angioplasty    . Vaginal hysterectomy      Current Outpatient Rx  Name  Route  Sig  Dispense  Refill  . carvedilol (COREG) 3.125 MG tablet   Oral   Take 3.125 mg by mouth 2 (two) times daily with a meal.         . KLOR-CON M20 20 MEQ tablet               . torsemide (DEMADEX) 20 MG tablet                 Allergies Codeine and Sulfa  antibiotics  History reviewed. No pertinent family history.  Social History History  Substance Use Topics  . Smoking status: Current Every Day Smoker  . Smokeless tobacco: Not on file  . Alcohol Use: No    Review of Systems  The review of systems is negative except for as noted in the history of present illness blood pressure is usually reported to run low probably 90 systolic patient's blood pressure today seems to be lower than usual for her  10-point ROS otherwise negative.  ____________________________________________   PHYSICAL EXAM:  VITAL SIGNS: ED Triage Vitals  Enc Vitals Group     BP 04/07/15 1125 80/65 mmHg     Pulse Rate 04/07/15 1125 56     Resp 04/07/15 1154 25     Temp 04/07/15 1125 98.8 F (37.1 C)     Temp Source 04/07/15 1125 Oral     SpO2 04/07/15 1125 94 %     Weight --      Height --      Head Cir --      Peak Flow --      Pain Score --      Pain Loc --      Pain Edu? --      Excl. in [place]? --  Constitutional: Alert and oriented. Well appearing and in no distress. Eyes: Conjunctivae are normal. PERRL. Normal extraocular movements. ENT   Head: Normocephalic and atraumatic.   Nose: No congestion/rhinnorhea.   Mouth/Throat: Mucous membranes are moist.   Neck: No stridor. Hematological/Lymphatic/Immunilogical: No cervical lymphadenopathy. Cardiovascular: Normal rate, regular rhythm. Normal and symmetric distal pulses are present in all extremities. No murmurs, rubs, or gallops. Respiratory: Normal respiratory effort without tachypnea nor retractions. Examination of breath sounds show occasional scattered wheezes and slight crackles in the bases Gastrointestinal: Soft and nontender. No distention. No abdominal bruits. There is no CVA tenderness. Genitourinary: Deferred Musculoskeletal: Nontender with normal range of motion in all extremities. No joint effusions.  No lower extremity tenderness nor edema. Neurologic:  Normal  speech and language. No gross focal neurologic deficits are appreciated. Speech is normal. Patient is unable to stand Skin:  Patient's legs are swollen but warm is difficult to palpate pulses distally because of the swelling left leg is warmer than the right and red to above-the-knee R some shallow ulcers on the distal left leg Psychiatric: Mood and affect are normal. Speech and behavior are normal. Patient exhibits appropriate insight and judgment.  ____________________________________________   EKG    ____________________________________________    RADIOLOGY  Chest x-ray pending and will not affect the disposition of this admission  ____________________________________________   PROCEDURES  Procedure(s) performed: None   ----------------------------------------- 12:47 PM on 04/07/2015 -----------------------------------------  Rechecked patient shows a blood pressure has increased slightly in the 200 cc fluid boluses going now patient remains awake alert and in no distress ----------------------------------------- 1:33 PM on 04/07/2015 -----------------------------------------  Reexam shows first fluid bolus was gone in patient remains in no distress O2 sats are good however blood pressure has which had gone up to 95 systolic is now back down 81 do a second fluid bolus have ordered antibiotics we'll plan on admitting patient  Critical Care performed: No  ____________________________________________   INITIAL IMPRESSION / ASSESSMENT AND PLAN / ED COURSE  Pertinent labs & imaging results that were available during my care of the patient were reviewed by me and considered in my medical decision making (see chart for details).    ____________________________________________   FINAL CLINICAL IMPRESSION(S) / ED DIAGNOSES  Final diagnoses:  Cellulitis of leg, left     Nena Polio, MD 04/07/15 1342

## 2015-04-08 LAB — COMPREHENSIVE METABOLIC PANEL
ALBUMIN: 2.7 g/dL — AB (ref 3.5–5.0)
ALK PHOS: 82 U/L (ref 38–126)
ALT: 19 U/L (ref 14–54)
AST: 31 U/L (ref 15–41)
Anion gap: 8 (ref 5–15)
BILIRUBIN TOTAL: 1.3 mg/dL — AB (ref 0.3–1.2)
BUN: 18 mg/dL (ref 6–20)
CO2: 29 mmol/L (ref 22–32)
Calcium: 8.4 mg/dL — ABNORMAL LOW (ref 8.9–10.3)
Chloride: 101 mmol/L (ref 101–111)
Creatinine, Ser: 0.9 mg/dL (ref 0.44–1.00)
GFR calc non Af Amer: 55 mL/min — ABNORMAL LOW (ref >60)
Glucose, Bld: 101 mg/dL — ABNORMAL HIGH (ref 65–99)
POTASSIUM: 3.8 mmol/L (ref 3.5–5.1)
SODIUM: 138 mmol/L (ref 135–145)
Total Protein: 6.4 g/dL — ABNORMAL LOW (ref 6.5–8.1)

## 2015-04-08 LAB — BLOOD GAS, VENOUS
Acid-Base Excess: 8.6 mmol/L — ABNORMAL HIGH (ref 0.0–3.0)
BICARBONATE: 35.7 meq/L — AB (ref 21.0–28.0)
FIO2: 0.21 %
PATIENT TEMPERATURE: 37
PCO2 VEN: 59 mmHg (ref 44.0–60.0)
PH VEN: 7.39 (ref 7.320–7.430)

## 2015-04-08 LAB — TROPONIN I: Troponin I: 0.03 ng/mL (ref <0.031)

## 2015-04-08 LAB — CBC
HCT: 37.3 % (ref 35.0–47.0)
HEMOGLOBIN: 12.4 g/dL (ref 12.0–16.0)
MCH: 32.4 pg (ref 26.0–34.0)
MCHC: 33.3 g/dL (ref 32.0–36.0)
MCV: 97.3 fL (ref 80.0–100.0)
Platelets: 143 10*3/uL — ABNORMAL LOW (ref 150–440)
RBC: 3.83 MIL/uL (ref 3.80–5.20)
RDW: 14.2 % (ref 11.5–14.5)
WBC: 6.9 10*3/uL (ref 3.6–11.0)

## 2015-04-08 MED ORDER — SODIUM CHLORIDE 0.9 % IV SOLN: INTRAVENOUS | Status: DC

## 2015-04-08 MED ORDER — SODIUM CHLORIDE 0.9 % IV BOLUS (SEPSIS)
1000.0000 mL | Freq: Once | INTRAVENOUS | Status: AC
  Administered 2015-04-08: 1000 mL via INTRAVENOUS

## 2015-04-08 MED ORDER — ALPRAZOLAM 0.5 MG PO TABS
0.5000 mg | ORAL_TABLET | Freq: Once | ORAL | Status: AC
  Administered 2015-04-08: 0.5 mg via ORAL
  Filled 2015-04-08: qty 1

## 2015-04-08 MED ORDER — SODIUM CHLORIDE 0.9 % IV BOLUS (SEPSIS)
500.0000 mL | Freq: Once | INTRAVENOUS | Status: AC
  Administered 2015-04-08: 500 mL via INTRAVENOUS

## 2015-04-08 MED ORDER — COLLAGENASE 250 UNIT/GM EX OINT
TOPICAL_OINTMENT | Freq: Every day | CUTANEOUS | Status: DC
  Administered 2015-04-08 – 2015-04-11 (×4): via TOPICAL
  Filled 2015-04-08: qty 30

## 2015-04-08 NOTE — Progress Notes (Signed)
Dr Jannifer Franklin notified of low BP  04/08/2015 12:27 AM Alexandria Roberts

## 2015-04-08 NOTE — Evaluation (Signed)
Physical Therapy Evaluation Patient Details Name: Tashonda Pinkus MRN: [id number] DOB: 1924-05-18 Today's Date: 04/08/2015   History of Present Illness  presensted to ER with progressive fatigue, weakness; admitted with L LE cellulitis.  Of note, patient status post L LE stenting and angioplasty the week prior to admission.  Clinical Impression  Upon evaluation, patient lethargic, but arouseable to voice/gentle touch.  Generally confused and disorinted; oriented to self only.  Follows simple commands with increased time for processing. Bilat UE/LE strength and ROM globally weak and deconditioned, at least 3+ to 4-/5 throughout.  Very motor restless and fidgety with bilat hands.  Currently requiring mod/max assist for all bed mobility, sit/stand training with RW. Very poor standing balance; high fall risk. Would benefit from skilled PT to address above deficits and promote optimal return to PLOF ; recommend transition to STR upon discharge from acute hospitalization. Patient/family aware of recommendations and in agreement with plan.     Follow Up Recommendations SNF    Equipment Recommendations  Rolling walker with 5" wheels;3in1 (PT)    Recommendations for Other Services       Precautions / Restrictions Precautions Precautions: Fall      Mobility  Bed Mobility Overal bed mobility: +2 for physical assistance;Needs Assistance Bed Mobility: Supine to Sit;Sit to Supine     Supine to sit: Max assist;+2 for physical assistance Sit to supine: Max assist;+2 for physical assistance   General bed mobility comments: assist to initiate movement, negotiate LEs and elevate trunk.  Once positioned in midline in unsupported sitting, able to maintain with close sup  Transfers Overall transfer level: Needs assistance Equipment used: Rolling walker (2 wheeled) Transfers: Sit to/from Stand Sit to Stand: Mod assist;+2 physical assistance         General transfer comment: very heavy posterior  weight shift/pushing with absent spontaneous righting reactions.    Ambulation/Gait             General Gait Details: unsafe/unable at this time due to poor standing balance  Stairs            Wheelchair Mobility    Modified Rankin (Stroke Patients Only)       Balance Overall balance assessment: Needs assistance Sitting-balance support: No upper extremity supported Sitting balance-Leahy Scale: Fair     Standing balance support: Bilateral upper extremity supported Standing balance-Leahy Scale: Zero Standing balance comment: very heavy posterior weight shift                             Pertinent Vitals/Pain Pain Assessment: No/denies pain    Home Living Family/patient expects to be discharged to:: Private residence Living Arrangements: Alone Available Help at Discharge: Family Type of Home: House Home Access: Stairs to enter Entrance Stairs-Rails: None Entrance Stairs-Number of Steps: 1 Home Layout: One level Home Equipment: Environmental consultant - 2 wheels Additional Comments: daughter lives across the street and provides frequent check in throughout the day; not available for 24 hour sup/assist    Prior Function Level of Independence: Needs assistance   Gait / Transfers Assistance Needed: able to mobilize limited household distances with RW, mod indep; daughter does endorse frequent fall history  ADL's / Homemaking Assistance Needed: daughter assists with all bathing, cooking and household chores        Hand Dominance        Extremity/Trunk Assessment   Upper Extremity Assessment: Generalized weakness (bilat UEs grossly WFL and symmetrical for ROM; strength at least  3+/5)           Lower Extremity Assessment: Generalized weakness (ROM grossly WFL and symmetrical; strength at least 4-/5)      Cervical / Trunk Assessment:  (forward head, rounded shoulders)  Communication   Communication: No difficulties;HOH  Cognition Arousal/Alertness:  Lethargic (alertness improves with transition to upright) Behavior During Therapy: WFL for tasks assessed/performed Overall Cognitive Status: History of cognitive impairments - at baseline                      General Comments General comments (skin integrity, edema, etc.): reddened areas to bilat LEs, L > R, with noted edema throughout; small open areas to L LE    Exercises Other Exercises Other Exercises: Additional sit/stand with RW x3 reps, mod assist +2 progressing towards max assist +2 as fatigue increased.  Constant manual assist for anterior weight translation; unable to initiate or sustain without constant assist from therapist.  Fatigues quickly and requires seated rest break after approx 30-45 seconds of static stance.      Assessment/Plan    PT Assessment Patient needs continued PT services  PT Diagnosis Difficulty walking;Generalized weakness   PT Problem List    PT Treatment Interventions DME instruction;Gait training;Stair training;Functional mobility training;Therapeutic activities;Therapeutic exercise;Balance training;Patient/family education   PT Goals (Current goals can be found in the Care Plan section) Acute Rehab PT Goals Patient Stated Goal: unable to verbalize PT Goal Formulation: With patient/family Time For Goal Achievement: 04/22/15 Potential to Achieve Goals: Fair    Frequency Min 2X/week   Barriers to discharge Inaccessible home environment;Decreased caregiver support      Co-evaluation               End of Session Equipment Utilized During Treatment: Gait belt Activity Tolerance: Patient limited by fatigue Patient left: in bed;with call bell/phone within reach;with bed alarm set;with family/visitor present           Time: 1411-1440 PT Time Calculation (min) (ACUTE ONLY): 29 min   Charges:   PT Evaluation $Initial PT Evaluation Tier I: 1 Procedure PT Treatments $Therapeutic Activity: 8-22 mins   PT G Codes:        Ellison Hughs, PT  Ellison Hughs 04/08/2015, 4:48 PM

## 2015-04-08 NOTE — Progress Notes (Signed)
Patient ID: Alexandria Roberts, female   DOB: Jul 09, 1924, 81 y.o.   MRN: [id number] [place] [id number] DOB: [birth year] DOA: 04/07/2015 PCP: Marden Noble, MD  HPI/Subjective: Called by nurse for hypotension. Patient did not fall asleep until early morning hours. Patient is still sleeping and is unable to be aroused at this time. Unable to ask her any questions. As per son at the bedside, he states that the patient's blood pressure is usually low between 85 and 95 systolic.  Objective: Filed Vitals:   04/08/15 0750  BP: 74/57  Pulse: 57  Temp: 96.9 F (36.1 C)  Resp: 20    Intake/Output Summary (Last 24 hours) at 04/08/15 0913 Last data filed at 04/08/15 0750  Gross per 24 hour  Intake   1048 ml  Output   1050 ml  Net     -2 ml   Filed Weights   04/07/15 1614  Weight: 67.042 kg (147 lb 12.8 oz)    ROS: Review of Systems  Unable to perform ROS  patient is sleeping soundly unable to be aroused  Exam: Physical Exam  Constitutional: She is sleeping.  HENT:  Nose: No mucosal edema.  Eyes: Conjunctivae and lids are normal. Pupils are equal, round, and reactive to light.  Neck: No JVD present. Carotid bruit is not present. No edema present. No thyroid mass and no thyromegaly present.  Cardiovascular: S1 normal and S2 normal.  Exam reveals no gallop.   No murmur heard. Pulses:      Dorsalis pedis pulses are 1+ on the right side, and 1+ on the left side.  Respiratory: No respiratory distress. She has no wheezes. She has no rhonchi. She has no rales.  GI: Soft. Bowel sounds are normal. There is no tenderness.  Musculoskeletal:       Right ankle: She exhibits swelling.       Left ankle: She exhibits swelling.  Lymphadenopathy:    She has no cervical adenopathy.  Neurological: No cranial nerve deficit.  Skin: Skin is warm. Rash noted. Nails show no clubbing.   skin exam continued: Left leg posteriorly about a quarter size ulcer.  Anteriorly small ulcer. Patient has erythema left leg going above the knee down into the foot. Right leg small skin tear anteriorly.    Data Reviewed: Basic Metabolic Panel:  Recent Labs Lab 04/03/15 1302 04/07/15 1121 04/07/15 1653 04/08/15 0443  NA  --  138  --  138  K  --  4.4  --  3.8  CL  --  101  --  101  CO2  --  29  --  29  GLUCOSE  --  121*  --  101*  BUN 18 18  --  18  CREATININE 0.95 1.03* 0.95 0.90  CALCIUM  --  8.6*  --  8.4*   Liver Function Tests:  Recent Labs Lab 04/07/15 1121 04/08/15 0443  AST 38 31  ALT 22 19  ALKPHOS 91 82  BILITOT 0.9 1.3*  PROT 7.4 6.4*  ALBUMIN 3.1* 2.7*     Recent Labs Lab 04/07/15 1121 04/07/15 1653 04/08/15 0443  WBC 9.2 9.5 6.9  NEUTROABS 6.8*  --   --   HGB 14.2 14.1 12.4  HCT 42.6 43.2 37.3  MCV 96.8 97.0 97.3  PLT 168 165 143*   Cardiac Enzymes:  Recent Labs Lab 04/07/15 1121 04/07/15 1520 04/07/15 2055 04/08/15 0443  CKTOTAL  --  27*  --   --  CKMB  --  1.8  --   --   TROPONINI 0.06* 0.04* 0.04* 0.03      Studies: US Venous Img Lower Unilateral Left  04/07/2015   CLINICAL DATA:  Arterial stent placement Thursday. Edema since Saturday. History of DVT ,breast carcinoma. On Plavix.  EXAM: LEFT LOWER EXTREMITY VENOUS DOPPLER ULTRASOUND  TECHNIQUE: Gray-scale sonography with compression, as well as color and duplex ultrasound, were performed to evaluate the deep venous system from the level of the common femoral vein through the popliteal and proximal calf veins.  COMPARISON:  None  FINDINGS: Normal compressibility of the common femoral, superficial femoral, and popliteal veins, as well as the proximal calf veins. No filling defects to suggest DVT on grayscale or color Doppler imaging. Doppler waveforms show normal direction of venous flow, normal respiratory phasicity and response to augmentation. There is a 46 x 24 x 35 mm fluid collection in the posterior popliteal fossa. Survey views of the contralateral  common femoral vein are unremarkable.  IMPRESSION: 1. No evidence of lower extremity deep vein thrombosis, LEFT. 2. Left Baker's cyst.   Electronically Signed   By: Lucrezia Europe M.D.   On: 04/07/2015 15:43    Scheduled Meds: . clopidogrel  75 mg Oral Daily  . heparin  5,000 Units Subcutaneous 3 times per day  . piperacillin-tazobactam  3.375 g Intravenous 3 times per day  . sodium chloride  1,000 mL Intravenous Once  . vancomycin  750 mg Intravenous Q24H     Assessment/Plan: Active Problems:   Cellulitis   1. Cellulitis left lower extremity with peripheral vascular disease and ulcers-the patient was put on aggressive antibiotics Zosyn and vancomycin. We will continue to observe on a daily basis. 2. Relative hypotension. The patient's normal blood pressure is systolic of 65-99. I will give a fluid bolus now and continue to monitor. 3. Cystitis without hematuria. Zosyn should cover. urine culture pending. 4. Vascular dementia-will try dose of Seroquel at night to sleep. 5. Chest pain reported yesterday, unable to speak with the patient today. Patient already on Plavix.  Code Status:     Code Status Orders        Start     Ordered   04/07/15 1612  Full code   Continuous     04/07/15 1611    Advance Directive Documentation        Most Recent Value   Type of Advance Directive  Healthcare Power of Attorney   Pre-existing out of facility DNR order (yellow form or pink MOST form)     "MOST" Form in Place?       Family Communication: Son at bedside Disposition Plan: Potentially home depending on hospital course.  Time spent: 25 minutes in coordination of care and discussing case with nurse.    Loletha Grayer  [facility]

## 2015-04-08 NOTE — Progress Notes (Signed)
MD notified of BP improving after bolus. Family requests for meds to help patient sleep.

## 2015-04-08 NOTE — Consult Note (Signed)
Patient Demographics  Alexandria Roberts, is a 81 y.o. female MRN: [id number] DOB - 1924-08-14  Admit Date - 04/07/2015  Outpatient Primary MD for the patient is Marden Noble, MD     Chief Complaint  Patient presents with  . Leg Pain     HPI  Alexandria Roberts is a 81 y.o. female with past medical history is significant for PAD with angioplasty and stent by Dr. Lucky Cowboy last Thursday, CAD, essential hypertension, breast cancer. History of present illness is taken from the son who is at bedside. Patient presented to Dr. Ozella Almond office last Thursday for angioplasty and stent placement since that time patient's been c/o increased fatigued and weakness.  The family noticed her left leg was increasingly swollen and tender since yesterday. She denies any fevers or chills. Patient presents today with signs and symptoms consistent with left lower extremity cellulitis.   Review of Systems  Due to dementia patient cannot provide a full review of systems.  Social History History  Substance Use Topics  . Smoking status: Current Every Day Smoker  . Smokeless tobacco: Not on file  . Alcohol Use: No     Past Medical History  Diagnosis Date  . COPD (chronic obstructive pulmonary disease)   . Heart attack   . Breast cancer   . Atrial fibrillation    SURGICAL History: Hysterectomy Partial Masectomy   Family History History reviewed. No pertinent family history.   Prior to Admission medications   Medication Sig Start Date End Date Taking? Authorizing Provider  carvedilol (COREG) 3.125 MG tablet Take 3.125 mg by mouth every morning.    Yes Historical Provider, MD  clopidogrel (PLAVIX) 75 MG tablet Take 75 mg by mouth every morning.   Yes Historical Provider, MD  KLOR-CON M20 20 MEQ tablet Take 20 mEq by mouth every morning.  03/08/14  Yes Historical Provider, MD  Multiple Vitamins-Minerals (MULTIVITAMIN ADULT PO) Take 1 tablet by  mouth every morning.   Yes Historical Provider, MD  torsemide (DEMADEX) 20 MG tablet Take 20 mg by mouth every morning.  03/08/14  Yes Historical Provider, MD  levofloxacin (LEVAQUIN) 500 MG tablet Take 500 mg by mouth daily. 03/29/15   Historical Provider, MD    Allergies  Allergen Reactions  . Codeine Other (See Comments)    Reaction: Unknown  . Sulfa Antibiotics Other (See Comments)    Reaction: Unknown    Physical Exam  Vitals  Blood pressure 106/81, pulse 32, temperature 98.8 F (37.1 C), temperature source Oral, resp. rate 28, SpO2 92 %.   Constitutional: Well-developed and well-nourished. No distress.  HENT:  Head: Normocephalic and atraumatic.  Mouth/Throat: Oropharynx is clear and moist.  Eyes: Pupils are equal, round, and reactive to light.  Neck: Normal range of motion. Neck supple. No JVD present. No tracheal deviation present. No thyromegaly present.  Cardiovascular: Normal rate, regular rhythm and normal heart sounds. Exam reveals no gallop.  2/6 SE murmur heard. Femoral pulses ar 2+, popliteal pulses are nonpalpable bilaterally, pedal pulses are nonpalpable bilaterally.  3+ lower extremity edema bilaterally Respiratory: Effort normal and breath sounds normal. No respiratory distress. No wheezing, crackles, rales. GI: Soft. No distension. There is no tenderness. There is no rebound and no guarding.  Musculoskeletal:No edema or tenderness.  Neurological: Alert No cranial nerve deficit. Coordination normal.  Skin: Left shin is Erythematous, tender, and warm to touch. She has a 2 x 2 small ulcer noted at the bottom of her shin. It is also noted she has  abrasions over the elbows.Marland Kitchen  Psychiatric: Dementia  Data Review  CBC  Last Labs      Recent Labs Lab 04/07/15 1121  WBC 9.2  HGB 14.2  HCT 42.6  PLT 168  MCV 96.8  MCH 32.2  MCHC 33.3  RDW 13.9  LYMPHSABS 0.8*  MONOABS 1.5*  EOSABS 0.0   BASOSABS 0.1     ------------------------------------------------------------------------------------------------------------------  Chemistries   Last Labs      Recent Labs Lab 04/03/15 1302 04/07/15 1121  NA --  138  K --  4.4  CL --  101  CO2 --  29  GLUCOSE --  121*  BUN 18 18  CREATININE 0.95 1.03*  CALCIUM --  8.6*  AST --  38  ALT --  22  ALKPHOS --  91  BILITOT --  0.9     ------------------------------------------------------------------------------------------------------------------ CrCl cannot be calculated (Unknown ideal weight.). ------------------------------------------------------------------------------------------------------------------   Coagulation profile  Last Labs     No results for input(s): INR, PROTIME in the last 168 hours.   ------------------------------------------------------------------------------------------------------------------- -------------------------------------------------------------------------------------------------------------------  Cardiac Enzymes  Last Labs      Recent Labs Lab 04/07/15 1121  TROPONINI 0.06*     ------------------------------------------------------------------------------------------------------------------  Last Labs     Invalid input(s): POCBNP     ---------------------------------------------------------------------------------------------------------------  Urinalysis  Labs (Brief)       Component Value Date/Time   COLORURINE YELLOW* 04/07/2015 1221   APPEARANCEUR CLOUDY* 04/07/2015 1221   LABSPEC 1.015 04/07/2015 1221   PHURINE 5.0 04/07/2015 1221   GLUCOSEU NEGATIVE 04/07/2015 1221   HGBUR 1+* 04/07/2015 1221   BILIRUBINUR NEGATIVE 04/07/2015 1221   KETONESUR NEGATIVE 04/07/2015 1221   PROTEINUR NEGATIVE 04/07/2015 1221   NITRITE NEGATIVE 04/07/2015 1221   LEUKOCYTESUR  3+* 04/07/2015 1221      ----------------------------------------------------------------------------------------------------------------  Imaging results:    EKG pending   Assessment & Plan  1. Left lower extremely cellulitis: Patient will be admitted to medical surgical services. We'll consult Dr. do as the patient recently had a angioplasty. Patient will be placed on broad-spectrum antibiotics including vancomycin and Zosyn. Blood cultures were ordered in the emergency department. I will order a lower extremity Doppler to rule out a DVT. 2. Atherosclerotic occlusive disease s/p intervention: clinically her leg is well perfused and I believe her intervention is patent.  I would continue to treat the cellulitis and consider angiography only if she seems to be failing treatment. 3. Cystitis without hematuria patient's currently on Zosyn which we'll continue urine culture was ordered in the emergency department which I'll follow up on. 4. Chest pain: Patient is complaining of chest pain according to the family members. We will continue to monitor troponins. I will order an EKG. She is on Plavix which we will continue. It is noted that her troponin is 0.06 we will obtain 2 more sets of troponins and CPK. 5. hypertension: We will hold blood pressure medications as a blood pressure is low normal. We'll need to follow carefully.   DVT Prophylaxis Heparin SCDs     Family Communication: Admission, patients condition and plan of care including tests being ordered have been discussed with the patient and family who indicate understanding and agree with the plan and Code Status.  Code Status DO NOT RESUSCITATE

## 2015-04-09 ENCOUNTER — Inpatient Hospital Stay: Payer: Medicare Other

## 2015-04-09 LAB — BASIC METABOLIC PANEL
ANION GAP: 8 (ref 5–15)
BUN: 18 mg/dL (ref 6–20)
CO2: 27 mmol/L (ref 22–32)
Calcium: 8.3 mg/dL — ABNORMAL LOW (ref 8.9–10.3)
Chloride: 104 mmol/L (ref 101–111)
Creatinine, Ser: 0.86 mg/dL (ref 0.44–1.00)
GFR calc non Af Amer: 59 mL/min — ABNORMAL LOW (ref >60)
Glucose, Bld: 87 mg/dL (ref 65–99)
POTASSIUM: 3.7 mmol/L (ref 3.5–5.1)
SODIUM: 139 mmol/L (ref 135–145)

## 2015-04-09 MED ORDER — LACTULOSE 10 GM/15ML PO SOLN
30.0000 g | Freq: Once | ORAL | Status: AC
  Administered 2015-04-09: 30 g via ORAL
  Filled 2015-04-09: qty 60

## 2015-04-09 MED ORDER — FUROSEMIDE 10 MG/ML IJ SOLN
20.0000 mg | Freq: Once | INTRAMUSCULAR | Status: AC
  Administered 2015-04-09: 20 mg via INTRAVENOUS
  Filled 2015-04-09: qty 2

## 2015-04-09 MED ORDER — IPRATROPIUM-ALBUTEROL 0.5-2.5 (3) MG/3ML IN SOLN
3.0000 mL | RESPIRATORY_TRACT | Status: DC | PRN
  Administered 2015-04-09 – 2015-04-10 (×2): 3 mL via RESPIRATORY_TRACT
  Filled 2015-04-09 (×3): qty 3

## 2015-04-09 MED ORDER — SODIUM CHLORIDE 0.9 % IV BOLUS (SEPSIS)
500.0000 mL | Freq: Once | INTRAVENOUS | Status: AC
  Administered 2015-04-09: 500 mL via INTRAVENOUS

## 2015-04-09 MED ORDER — QUETIAPINE FUMARATE 25 MG PO TABS
12.5000 mg | ORAL_TABLET | Freq: Every day | ORAL | Status: DC
  Administered 2015-04-09 – 2015-04-10 (×2): 12.5 mg via ORAL
  Filled 2015-04-09 (×2): qty 1

## 2015-04-09 NOTE — Progress Notes (Signed)
Notified MD of BP after bolus; Orders to notify MD if SBP <80. Current BP at pt baseline

## 2015-04-09 NOTE — Clinical Social Work Note (Signed)
Clinical Social Work Assessment  Patient Details  Name: Alexandria Roberts MRN: [id number] Date of Birth: 1924/03/20  Date of referral:  04/09/15               Reason for consult:  Facility Placement                Permission sought to share information with:  Chartered certified accountant granted to share information::  Yes, Verbal Permission Granted  Name::        Agency::   [facility] SNF's)  Relationship::     Contact Information:     Housing/Transportation Living arrangements for the past 2 months:   (home) Source of Information:  Adult Children Patient Interpreter Needed:  None Criminal Activity/Legal Involvement Pertinent to Current Situation/Hospitalization:  No - Comment as needed Significant Relationships:  Adult Children Lives with:  Self Do you feel safe going back to the place where you live?  Yes Need for family participation in patient care:  Yes (Comment)  Care giving concerns:  Patient's daughter: Alexandria Roberts: [phone] resides across the street from patient is states she is not able to stay with her mother and care for her 24/7.   Social Worker assessment / plan:  CSW spoke with patient's daughter this afternoon. Patient currently is confused to situation, place, and time. 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 Patient was married 56 years until her husband passed 10 years ago. Patient's daughter explains that patient has been grieving for her deceased husband for most of that time. Patient's daughter states that she is in agreement with rehab placement for her mother and she states that she and her brother: Alexandria Roberts will discuss long term plans. Patient's daughter states that Alexandria Roberts wishes to be able to take patient home eventually and care for her but she states that this is not feasible as he works and his wife is in poor health. She reports that she has already begun the [facility] application process.    Employment status:  Software engineer:  Managed Care PT Recommendations:  [place] / Referral to community resources:  [place]  Patient/Family's Response to care:  thankful  Patient/Family's Understanding of and Emotional Response to Diagnosis, Current Treatment, and Prognosis:  Patient's daughter has a good understanding of placement process and is not wanting her mother to have to stay long term in a facility but that she is accepting that this may be the only option down the road. She has watched patient's 4 siblings die of end stage alzheimers.   Emotional Assessment Appearance:  Appears stated age Attitude/Demeanor/Rapport:   (appropriate) Affect (typically observed):    Orientation:  Oriented to Self Alcohol / Substance use:  Not Applicable, Never Used Psych involvement (Current and /or in the community):  No (Comment)  Discharge Needs  Concerns to be addressed:  Discharge Planning Concerns Readmission within the last 30 days:  No Current discharge risk:  None Barriers to Discharge:  No Barriers Identified   Kirkland Hun 04/09/2015, 2:12 PM

## 2015-04-09 NOTE — Consult Note (Signed)
WOC wound consult note Reason for Consult:  Chronic venous ulcer with resolving cellulitis distal posterior left leg. Wound type: Chronic venous ulcer with resolving cellulitis. Pressure Ulcer POA: Yes Measurement:  2 cm x 1.5 CM full thickness ulcer with 100% hardened yellow slough. Wound bed:  100% hardened yellow slough. Drainage (amount, consistency, odor) Scant amount serous sanguinous. Periwound:  Warm and reddened.  Resolving per dgt. Dressing procedure/placement/frequency:  Santyl dressing as per orders. Conservative sharp wound debridement (CSWD performed at the bedside)  N/A  Will not follow at this time.  Please re-consult if needed.  Domenic Moras RN BSN [place] Pager [phone]

## 2015-04-09 NOTE — Progress Notes (Signed)
Notified Dr. Jannifer Franklin of Low BP

## 2015-04-09 NOTE — Progress Notes (Signed)
Patient ID: Alexandria Roberts, female   DOB: 11/20/1924, 81 y.o.   MRN: [id number] [facility] PROGRESS NOTE  DOA: 04/07/2015 PCP: Marden Noble, MD  HPI/Subjective: Patient complaint to daughter about shortness of breath. To me she stated she felt fine. When she was reminded by her daughter then she said she was short of breath. He does not recall why she is here in the hospital. Positive for constipation.  Objective: Filed Vitals:   04/09/15 0901  BP: 77/58  Pulse: 86  Temp: 99.2 F (37.3 C)  Resp: 24    Intake/Output Summary (Last 24 hours) at 04/09/15 1038 Last data filed at 04/09/15 0903  Gross per 24 hour  Intake    970 ml  Output   1200 ml  Net   -230 ml   Filed Weights   04/07/15 1614  Weight: 67.042 kg (147 lb 12.8 oz)    ROS: Review of Systems  Constitutional: Negative for fever and chills.  Eyes: Negative for blurred vision.  Respiratory: Positive for shortness of breath. Negative for cough.   Cardiovascular: Negative for chest pain.  Gastrointestinal: Positive for constipation. Negative for nausea, vomiting and diarrhea.  Genitourinary: Negative for dysuria.  Musculoskeletal: Negative for joint pain.  Neurological: Negative for dizziness and headaches.   Exam: Physical Exam  HENT:  Nose: No mucosal edema.  Mouth/Throat: No oropharyngeal exudate or posterior oropharyngeal edema.  Eyes: Conjunctivae, EOM and lids are normal. Pupils are equal, round, and reactive to light.  Neck: No JVD present. Carotid bruit is not present. No edema present. No thyroid mass and no thyromegaly present.  Cardiovascular: S1 normal and S2 normal.  Exam reveals no gallop.   No murmur heard. Pulses:      Dorsalis pedis pulses are 1+ on the right side, and 1+ on the left side.  Respiratory: Accessory muscle usage present. No respiratory distress. She has no wheezes. She has no rhonchi. She has rales in the left lower field.  GI: Soft. Bowel sounds are normal. There  is no tenderness.  Lymphadenopathy:    She has no cervical adenopathy.  Neurological: She is alert. No cranial nerve deficit.  Skin: Skin is warm. No rash noted. Nails show no clubbing.  Psychiatric: She has a normal mood and affect.   skin exam continued: Patient has erythema of the left lower extremity foot up to lower shin. Ulceration on the left lower extremity unchanged.  Data Reviewed: Basic Metabolic Panel:  Recent Labs Lab 04/03/15 1302 04/07/15 1121 04/07/15 1653 04/08/15 0443 04/09/15 0425  NA  --  138  --  138 139  K  --  4.4  --  3.8 3.7  CL  --  101  --  101 104  CO2  --  29  --  29 27  GLUCOSE  --  121*  --  101* 87  BUN 18 18  --  18 18  CREATININE 0.95 1.03* 0.95 0.90 0.86  CALCIUM  --  8.6*  --  8.4* 8.3*   Liver Function Tests:  Recent Labs Lab 04/07/15 1121 04/08/15 0443  AST 38 31  ALT 22 19  ALKPHOS 91 82  BILITOT 0.9 1.3*  PROT 7.4 6.4*  ALBUMIN 3.1* 2.7*   CBC:  Recent Labs Lab 04/07/15 1121 04/07/15 1653 04/08/15 0443  WBC 9.2 9.5 6.9  NEUTROABS 6.8*  --   --   HGB 14.2 14.1 12.4  HCT 42.6 43.2 37.3  MCV 96.8 97.0 97.3  PLT 168 165 143*   Cardiac Enzymes:  Recent Labs Lab 04/07/15 1121 04/07/15 1520 04/07/15 2055 04/08/15 0443  CKTOTAL  --  27*  --   --   CKMB  --  1.8  --   --   TROPONINI 0.06* 0.04* 0.04* 0.03     Recent Results (from the past 240 hour(s))  Culture, blood (routine x 2)     Status: None (Preliminary result)   Collection Time: 04/07/15  1:40 PM  Result Value Ref Range Status   Specimen Description BLOOD  Final   Special Requests BLOOD  Final   Culture NO GROWTH < 24 HOURS  Final   Report Status PENDING  Incomplete  Culture, blood (routine x 2)     Status: None (Preliminary result)   Collection Time: 04/07/15  1:45 PM  Result Value Ref Range Status   Specimen Description BLOOD  Final   Special Requests BLOOD  Final   Culture NO GROWTH < 24 HOURS  Final   Report Status PENDING  Incomplete      Studies: US Venous Img Lower Unilateral Left  04/07/2015   CLINICAL DATA:  Arterial stent placement Thursday. Edema since Saturday. History of DVT ,breast carcinoma. On Plavix.  EXAM: LEFT LOWER EXTREMITY VENOUS DOPPLER ULTRASOUND  TECHNIQUE: Gray-scale sonography with compression, as well as color and duplex ultrasound, were performed to evaluate the deep venous system from the level of the common femoral vein through the popliteal and proximal calf veins.  COMPARISON:  None  FINDINGS: Normal compressibility of the common femoral, superficial femoral, and popliteal veins, as well as the proximal calf veins. No filling defects to suggest DVT on grayscale or color Doppler imaging. Doppler waveforms show normal direction of venous flow, normal respiratory phasicity and response to augmentation. There is a 46 x 24 x 35 mm fluid collection in the posterior popliteal fossa. Survey views of the contralateral common femoral vein are unremarkable.  IMPRESSION: 1. No evidence of lower extremity deep vein thrombosis, LEFT. 2. Left Baker's cyst.   Electronically Signed   By: Lucrezia Europe M.D.   On: 04/07/2015 15:43   Dg Chest Port 1 View  04/09/2015   CLINICAL DATA:  Respiratory crackles.  EXAM: PORTABLE CHEST - 1 VIEW  COMPARISON:  03/16/2014  FINDINGS: There is unchanged moderate cardiomegaly. The lungs are clear. There is no large effusion. Pulmonary vasculature is normal.  IMPRESSION: Cardiomegaly.  No acute cardiopulmonary findings.   Electronically Signed   By: Andreas Newport M.D.   On: 04/09/2015 01:16    Scheduled Meds: . clopidogrel  75 mg Oral Daily  . collagenase   Topical Daily  . furosemide  20 mg Intravenous Once  . heparin  5,000 Units Subcutaneous 3 times per day  . piperacillin-tazobactam  3.375 g Intravenous 3 times per day  . vancomycin  750 mg Intravenous Q24H   Continuous Infusions:   Assessment/Plan: Active Problems:   Cellulitis   1. Cellulitis of the left lower extremity with  peripheral vascular disease and ulceration. -Patient placed on aggressive antibiotics vancomycin and Zosyn.  -Ultrasound the left lower extremity negative 2. Fluid overload -We'll give 1 dose of IV Lasix. -Likely from numerous fluid boluses secondary to hypotension. 3. Relative hypotension -As per family, the patient's blood pressure is always on the lower side. 4. Vascular dementia -Seroquel at night 5. CODE STATUS changed to DO NOT RESUSCITATE.  Code Status:     Code Status Orders  Start     Ordered   04/09/15 1038  Do not attempt resuscitation (DNR)   Continuous    Question Answer Comment  In the event of cardiac or respiratory ARREST Do not call a "code blue"   In the event of cardiac or respiratory ARREST Do not perform Intubation, CPR, defibrillation or ACLS   In the event of cardiac or respiratory ARREST Use medication by any route, position, wound care, and other measures to relive pain and suffering. May use oxygen, suction and manual treatment of airway obstruction as needed for comfort.   Comments nurse may pronounce      04/09/15 1037    Advance Directive Documentation        Most Recent Value   Type of Advance Directive  Healthcare Power of Attorney   Pre-existing out of facility DNR order (yellow form or pink MOST form)     "MOST" Form in Place?       Family Communication: Family at bedside  Disposition Plan: Likely to rehabilitation.  Time spent: 25 minutes  Loletha Grayer  [facility]

## 2015-04-09 NOTE — Clinical Social Work Placement (Signed)
° °  CLINICAL SOCIAL WORK PLACEMENT  NOTE  Date:  04/09/2015  Patient Details  Name: Debbera Wolken MRN: [id number] Date of Birth: 05/30/1924  Clinical Social Work is seeking post-discharge placement for this patient at the [place] level of care (*CSW will initial, date and re-position this form in  chart as items are completed):  Yes   Patient/family provided with [place] Work Departments list of facilities offering this level of care within the geographic area requested by the patient (or if unable, by the patients family).  Yes   Patient/family informed of their freedom to choose among providers that offer the needed level of care, that participate in Medicare, Medicaid or managed care program needed by the patient, have an available bed and are willing to accept the patient.  Yes   Patient/family informed of [facility] ownership interest in [facility] and [facility], as well as of the fact that they are under no obligation to receive care at these facilities.  PASRR submitted to EDS on 04/09/15     PASRR number received on 04/09/15     Existing PASRR number confirmed on       FL2 transmitted to all facilities in geographic area requested by pt/family on 04/09/15     FL2 transmitted to all facilities within larger geographic area on       Patient informed that his/her managed care company has contracts with or will negotiate with certain facilities, including the following:            Patient/family informed of bed offers received.  Patient chooses bed at       Physician recommends and patient chooses bed at      Patient to be transferred to   on  .  Patient to be transferred to facility by       Patient family notified on   of transfer.  Name of family member notified:        PHYSICIAN       Additional Comment:    _______________________________________________ Kirkland Hun 04/09/2015, 2:32 PM

## 2015-04-09 NOTE — Progress Notes (Signed)
Notify MD Wieting of low BP

## 2015-04-10 LAB — BASIC METABOLIC PANEL
ANION GAP: 8 (ref 5–15)
BUN: 13 mg/dL (ref 6–20)
CHLORIDE: 102 mmol/L (ref 101–111)
CO2: 28 mmol/L (ref 22–32)
CREATININE: 0.76 mg/dL (ref 0.44–1.00)
Calcium: 8.4 mg/dL — ABNORMAL LOW (ref 8.9–10.3)
GFR calc non Af Amer: >60 mL/min (ref >60)
Glucose, Bld: 96 mg/dL (ref 65–99)
POTASSIUM: 3.2 mmol/L — AB (ref 3.5–5.1)
SODIUM: 138 mmol/L (ref 135–145)

## 2015-04-10 LAB — VANCOMYCIN, TROUGH: VANCOMYCIN TR: 10 ug/mL (ref 10.0–20.0)

## 2015-04-10 LAB — PLATELET COUNT: Platelets: 174 10*3/uL (ref 150–440)

## 2015-04-10 MED ORDER — POTASSIUM CHLORIDE CRYS ER 20 MEQ PO TBCR
40.0000 meq | EXTENDED_RELEASE_TABLET | Freq: Once | ORAL | Status: DC
  Filled 2015-04-10: qty 2

## 2015-04-10 MED ORDER — POTASSIUM CHLORIDE 2 MEQ/ML IV SOLN
INTRAVENOUS | Status: DC
  Filled 2015-04-10 (×7): qty 500

## 2015-04-10 MED ORDER — SODIUM CHLORIDE 0.9 % IV SOLN
Freq: Once | INTRAVENOUS | Status: AC
  Administered 2015-04-10: 13:00:00 via INTRAVENOUS
  Filled 2015-04-10 (×2): qty 500

## 2015-04-10 NOTE — Clinical Social Work Note (Signed)
Patient has had three bed offers: [facility], [facility], and [facility] CSW extended these offers to patient's daughter and son today. They are going to research the offers and let me know their answer in the morning.

## 2015-04-10 NOTE — Progress Notes (Signed)
Physical Therapy Treatment Patient Details Name: Simrit Gohlke MRN: [id number] DOB: 1924-02-22 Today's Date: 04/10/2015    History of Present Illness presensted to ER with progressive fatigue, weakness; admitted with L LE cellulitis.  Of note, patient status post L LE stenting and angioplasty the week prior to admission.    PT Comments    Improved alertness and participation with all activities this date; remains generally confused and oriented to self only.  Able to initiate OOB activities this date--requiring mod/max assist +2 with RW due to heavy posterior weight shift and poor balance reactions.  Fatigues quickly and requires frequent rest periods with all activities.  Follow Up Recommendations  SNF     Equipment Recommendations  Rolling walker with 5" wheels;3in1 (PT)    Recommendations for Other Services       Precautions / Restrictions Precautions Precautions: Fall Restrictions Weight Bearing Restrictions: No    Mobility  Bed Mobility Overal bed mobility: Needs Assistance Bed Mobility: Supine to Sit     Supine to sit: Mod assist;Max assist     General bed mobility comments: assist to initiate movement, negotiate LEs and elevate trunk.  Once positioned in midline in unsupported sitting, able to maintain with close sup  Transfers Overall transfer level: Needs assistance Equipment used: Rolling walker (2 wheeled) Transfers: Sit to/from Stand Sit to Stand: Mod assist;Max assist;+2 physical assistance         General transfer comment: assist for hand/foot placement, lift off and dynamic balance  Ambulation/Gait Ambulation/Gait assistance: Mod assist;Max assist;+2 physical assistance Ambulation Distance (Feet): 5 Feet Assistive device: Rolling walker (2 wheeled)       General Gait Details: very short, shuffling steps with heavy posterior trunk lean/weight shift.  Poor balance, high fall risk.  Moderate SOB with exertion; fatigues quickly.  Unable to tolerate  activity beyond bed/chair this date.   Stairs            Wheelchair Mobility    Modified Rankin (Stroke Patients Only)       Balance                                    Cognition Arousal/Alertness: Awake/alert Behavior During Therapy: WFL for tasks assessed/performed Overall Cognitive Status: History of cognitive impairments - at baseline       Memory: Decreased short-term memory              Exercises Other Exercises Other Exercises: Bilat LE seated therex, 1x10, AROM for muscular strength/endurance with functional activities: ankle pumps, LAQs, marching, iso hip adduct, manually resisted hip abduct.  Optimal performance achieved with return demonstration from therapist.    General Comments        Pertinent Vitals/Pain Pain Assessment: No/denies pain (no clinical indicators of pain)    Home Living                      Prior Function            PT Goals (current goals can now be found in the care plan section) Acute Rehab PT Goals Patient Stated Goal: unable to verbalize PT Goal Formulation: With patient/family Time For Goal Achievement: 04/22/15 Potential to Achieve Goals: Fair Additional Goals Additional Goal #1: Patient will complete all bed mobility indep for pulmonary hygiene and self-repositioning. Additional Goal #2: Patient to complete all basic transfers with LRAD, min assist, for access to all seating surfaces in  home environment. Progress towards PT goals: Progressing toward goals    Frequency  Min 2X/week    PT Plan      Co-evaluation             End of Session Equipment Utilized During Treatment: Gait belt Activity Tolerance: Patient limited by fatigue Patient left: in chair;with call bell/phone within reach;with chair alarm set;with family/visitor present     Time: 5146-0479 PT Time Calculation (min) (ACUTE ONLY): 26 min  Charges:  $Therapeutic Exercise: 8-22 mins $Therapeutic Activity: 8-22  mins                    G Codes:      Tandy Grawe H. Owens Shark, PT, DPT 04/10/2015 3:29 PM [place] 04/10/2015, 3:28 PM

## 2015-04-10 NOTE — Progress Notes (Signed)
[facility]  Daily Progress Note   Subjective  - denies leg pain this pm, daughter at bedside states she is doing better.    Objective Filed Vitals:   04/10/15 0804 04/10/15 1523 04/10/15 1558 04/10/15 1601  BP: 92/50  136/101 113/85  Pulse: 54 81 77 74  Temp: 97.5 F (36.4 C)   97.8 F (36.6 C)  TempSrc: Oral   Oral  Resp: 22  19   Height:      Weight:      SpO2: 100% 94% 98% 96%    Intake/Output Summary (Last 24 hours) at 04/10/15 2228 Last data filed at 04/10/15 2056  Gross per 24 hour  Intake    896 ml  Output   1650 ml  Net   -754 ml    PULM  Normal effort , no use of accessory muscles CV  No JVD, RRR Abd      No distended, nontender VASC  Left leg with less erythema and less tenderness, palpable 1+ DP pulse on left.  Laboratory CBC    Component Value Date/Time   WBC 6.9 04/08/2015 0443   WBC 6.7 03/15/2014 2220   HGB 12.4 04/08/2015 0443   HGB 15.0 03/15/2014 2220   HCT 37.3 04/08/2015 0443   HCT 47.8* 03/15/2014 2220   PLT 174 04/10/2015 0451   PLT 150 03/15/2014 2220    BMET    Component Value Date/Time   NA 138 04/10/2015 0451   NA 141 03/15/2014 2220   K 3.2* 04/10/2015 0451   K 4.5 03/15/2014 2220   CL 102 04/10/2015 0451   CL 106 03/15/2014 2220   CO2 28 04/10/2015 0451   CO2 35* 03/15/2014 2220   GLUCOSE 96 04/10/2015 0451   GLUCOSE 104* 03/15/2014 2220   BUN 13 04/10/2015 0451   BUN 18 04/03/2015 1302   CREATININE 0.76 04/10/2015 0451   CREATININE 0.95 04/03/2015 1302   CALCIUM 8.4* 04/10/2015 0451   CALCIUM 9.1 03/15/2014 2220   GFRNONAA >60 04/10/2015 0451   GFRNONAA 53* 04/03/2015 1302   GFRAA >60 04/10/2015 0451   GFRAA >60 04/03/2015 1302    Assessment/Planning: 1. Left lower extremely cellulitis: Patient continues on  broad-spectrum antibiotics including vancomycin and Zosyn. Blood cultures are negative.  Her leg is clearly improving and there is now a palpable left DP pulse (with the edema  decreased and the tenderness resolved)  2. Atherosclerotic occlusive disease s/p intervention: clinically her leg is well perfused and I believe her intervention is patent. I would continue to treat the cellulitis No angiography at this time. 3. Cystitis without hematuria patient's currently on Zosyn 4. Chest pain: Patient is complaining of chest pain according to the family members. We will continue to monitor troponins. I will order an EKG. She is on Plavix which we will continue. It is noted that her troponin is 0.06 we will obtain 2 more sets of troponins and CPK. 5. hypertension: We will hold blood pressure medications as a blood pressure is low normal. We'll need to follow carefully. Schnier, Dolores Lory  04/10/2015, 10:28 PM

## 2015-04-10 NOTE — Progress Notes (Signed)
ANTIBIOTIC CONSULT NOTE - INITIAL  Pharmacy Consult for Arlana Hove Indication: cellulitis  Allergies  Allergen Reactions  . Codeine Other (See Comments)    Reaction:  Unknown  . Sulfa Antibiotics Other (See Comments)    Reaction:  Unknown    Patient Measurements: Height: 5\' 4"  (162.6 cm) Weight: 147 lb 12.8 oz (67.042 kg) IBW/kg (Calculated) : 54.7  Vital Signs: Temp: 98.4 F (36.9 C) (05/05 0021) Temp Source: Oral (05/05 0021) BP: 85/55 mmHg (05/05 0021) Pulse Rate: 80 (05/05 0021) Intake/Output from previous day: 05/04 0701 - 05/05 0700 In: 402 [P.O.:300; IV Piggyback:102] Out: 1326 [Urine:1325; Stool:1] Intake/Output from this shift: Total I/O In: 60 [P.O.:60] Out: 425 [Urine:425]  Labs:  Recent Labs  04/07/15 1121 04/07/15 1653 04/08/15 0443 04/09/15 0425  WBC 9.2 9.5 6.9  --   HGB 14.2 14.1 12.4  --   PLT 168 165 143*  --   CREATININE 1.03* 0.95 0.90 0.86   Estimated Creatinine Clearance: 42.5 mL/min (by C-G formula based on Cr of 0.86).  Recent Labs  04/09/15 2145  [facility] 10     Microbiology: Recent Results (from the past 720 hour(s))  Culture, blood (routine x 2)     Status: None (Preliminary result)   Collection Time: 04/07/15  1:40 PM  Result Value Ref Range Status   Specimen Description BLOOD  Final   Special Requests BLOOD  Final   Culture NO GROWTH 2 DAYS  Final   Report Status PENDING  Incomplete  Culture, blood (routine x 2)     Status: None (Preliminary result)   Collection Time: 04/07/15  1:45 PM  Result Value Ref Range Status   Specimen Description BLOOD  Final   Special Requests BLOOD  Final   Culture NO GROWTH 2 DAYS  Final   Report Status PENDING  Incomplete    Medical History: Past Medical History  Diagnosis Date  . COPD (chronic obstructive pulmonary disease)   . Heart attack   . Breast cancer   . Atrial fibrillation     Medications:  Anti-infectives    Start     Dose/Rate Route Frequency Ordered Stop    04/07/15 2230  vancomycin (VANCOCIN) IVPB 750 mg/150 ml premix     750 mg 150 mL/hr over 60 Minutes Intravenous Every 24 hours 04/07/15 1653     04/07/15 1800  piperacillin-tazobactam (ZOSYN) IVPB 3.375 g  Status:  Discontinued     3.375 g 100 mL/hr over 30 Minutes Intravenous 4 times per day 04/07/15 1611 04/07/15 1704   04/07/15 1715  piperacillin-tazobactam (ZOSYN) IVPB 3.375 g     3.375 g 12.5 mL/hr over 240 Minutes Intravenous 3 times per day 04/07/15 1704     04/07/15 1345  vancomycin (VANCOCIN) IVPB 1000 mg/200 mL premix     1,000 mg 200 mL/hr over 60 Minutes Intravenous  Once 04/07/15 1330 04/07/15 1516   04/07/15 1330  cefTRIAXone (ROCEPHIN) 1 g in dextrose 5 % 50 mL IVPB  Status:  Discontinued     1 g 100 mL/hr over 30 Minutes Intravenous  Once 04/07/15 1330 04/07/15 1448     Assessment: Empiric abx ordered for cellulitis.    Goal of Therapy:  Vancomycin trough level 10-15 mcg/ml  Plan:  Zosyn 3.375 g EI q 8 h ordered. Vancomycin 1000 mg iv once given. Will begin 750 mg iv q 24 h with stacked dosing. Will check a trough with the 4th dose.  5/4 PM trough 10. Continue current regimen.  Mandeep Kiser  S 04/10/2015,5:54 AM

## 2015-04-10 NOTE — Progress Notes (Signed)
[facility]  Daily Progress Note   Subjective  - states leg feels better   Objective Filed Vitals:   04/10/15 0804 04/10/15 1523 04/10/15 1558 04/10/15 1601  BP: 92/50  136/101 113/85  Pulse: 54 81 77 74  Temp: 97.5 F (36.4 C)   97.8 F (36.6 C)  TempSrc: Oral   Oral  Resp: 22  19   Height:      Weight:      SpO2: 100% 94% 98% 96%    Intake/Output Summary (Last 24 hours) at 04/10/15 2322 Last data filed at 04/10/15 2056  Gross per 24 hour  Intake    896 ml  Output   1650 ml  Net   -754 ml    PULM  Normal effort , no use of accessory muscles CV  No JVD, RRR Abd      No distended, nontender VASC  Less edema the erythema is receeding trace DP pulse on the left  Laboratory CBC    Component Value Date/Time   WBC 6.9 04/08/2015 0443   WBC 6.7 03/15/2014 2220   HGB 12.4 04/08/2015 0443   HGB 15.0 03/15/2014 2220   HCT 37.3 04/08/2015 0443   HCT 47.8* 03/15/2014 2220   PLT 174 04/10/2015 0451   PLT 150 03/15/2014 2220    BMET    Component Value Date/Time   NA 138 04/10/2015 0451   NA 141 03/15/2014 2220   K 3.2* 04/10/2015 0451   K 4.5 03/15/2014 2220   CL 102 04/10/2015 0451   CL 106 03/15/2014 2220   CO2 28 04/10/2015 0451   CO2 35* 03/15/2014 2220   GLUCOSE 96 04/10/2015 0451   GLUCOSE 104* 03/15/2014 2220   BUN 13 04/10/2015 0451   BUN 18 04/03/2015 1302   CREATININE 0.76 04/10/2015 0451   CREATININE 0.95 04/03/2015 1302   CALCIUM 8.4* 04/10/2015 0451   CALCIUM 9.1 03/15/2014 2220   GFRNONAA >60 04/10/2015 0451   GFRNONAA 53* 04/03/2015 1302   GFRAA >60 04/10/2015 0451   GFRAA >60 04/03/2015 1302    Assessment/Planning: 1. Left lower extremely cellulitis: Patient continues on broad-spectrum antibiotics including vancomycin and Zosyn. Blood cultures are negative. Her leg is clearly improving and there is now a palpable left DP pulse (with the edema decreased and the tenderness resolved) 2. Atherosclerotic occlusive  disease s/p intervention: clinically her leg is well perfused and I believe her intervention is patent. I would continue to treat the cellulitis No angiography at this time. 3. Cystitis without hematuria patient's currently on Zosyn 4. Chest pain: Patient is complaining of chest pain according to the family members. We will continue to monitor troponins. I will order an EKG. She is on Plavix which we will continue. It is noted that her troponin is 0.06 we will obtain 2 more sets of troponins and CPK. 5. hypertension: We will hold blood pressure medications as a blood pressure is low normal. We'll need to follow carefully.   Alexandria Roberts, Alexandria Roberts 04/10/2015, 11:22 PM

## 2015-04-10 NOTE — Progress Notes (Signed)
[facility]    MR#:  [id number]  DATE OF BIRTH:  10-06-1924  SUBJECTIVE:  CHIEF COMPLAINT:   Chief Complaint  Patient presents with  . Leg Pain  Patient sleepy, but easily arousable and at baseline- oriented x2. Son at bedside. Says left leg edema is improving. Though she still has pain and difficulty walking.  REVIEW OF SYSTEMS:  CONSTITUTIONAL: No fever, fatigue or weakness.  EYES: No blurred or double vision.  EARS, NOSE, AND THROAT: No tinnitus or ear pain.  RESPIRATORY: No cough, shortness of breath, wheezing or hemoptysis.  CARDIOVASCULAR: No chest pain, orthopnea, edema.  GASTROINTESTINAL: No nausea, vomiting, diarrhea or abdominal pain.  GENITOURINARY: No dysuria, hematuria.  ENDOCRINE: No polyuria, nocturia,  HEMATOLOGY: No anemia, easy bruising or bleeding SKIN: No rash or lesion. MUSCULOSKELETAL: No joint pain or arthritis.  Left leg pain NEUROLOGIC: No tingling, numbness, weakness.  PSYCHIATRY: No anxiety or depression.   DRUG ALLERGIES:   Allergies  Allergen Reactions  . Codeine Other (See Comments)    Reaction:  Unknown  . Sulfa Antibiotics Other (See Comments)    Reaction:  Unknown    VITALS:  Blood pressure 92/50, pulse 54, temperature 97.5 F (36.4 C), temperature source Oral, resp. rate 22, height 5\' 4"  (1.626 m), weight 67.042 kg (147 lb 12.8 oz), SpO2 100 %.  PHYSICAL EXAMINATION:   GENERAL:  81 y.o.-year-old patient lying in the bed with no acute distress.  EYES: Pupils equal, round, reactive to light and accommodation. No scleral icterus. Extraocular muscles intact.  HEENT: Head atraumatic, normocephalic. Oropharynx and nasopharynx clear.  NECK:  Supple, no jugular venous distention. No thyroid enlargement, no tenderness.  LUNGS: Normal breath sounds bilaterally, no wheezing, rales,rhonchi or crepitation. No use of accessory muscles of respiration.  CARDIOVASCULAR:  S1, S2 normal. No murmurs, rubs, or gallops.  ABDOMEN: Soft, nontender, nondistended. Bowel sounds present. No organomegaly or mass.  EXTREMITIES: Left lower extremity is swollen, erythematous still- better than yesterday. No pedal edema, cyanosis, or clubbing.  NEUROLOGIC: Cranial nerves II through XII are intact. Muscle strength 5/5 in all extremities. Sensation intact. Gait not checked.  PSYCHIATRIC: The patient is sleepy, arousable and oriented x 2. Very pleasant SKIN: No obvious rash, lesion, or ulcer.    LABORATORY PANEL:   CBC  Recent Labs Lab 04/08/15 0443 04/10/15 0451  WBC 6.9  --   HGB 12.4  --   HCT 37.3  --   PLT 143* 174   ------------------------------------------------------------------------------------------------------------------  Chemistries   Recent Labs Lab 04/08/15 0443  04/10/15 0451  NA 138  < > 138  K 3.8  < > 3.2*  CL 101  < > 102  CO2 29  < > 28  GLUCOSE 101*  < > 96  BUN 18  < > 13  CREATININE 0.90  < > 0.76  CALCIUM 8.4*  < > 8.4*  AST 31  --   --   ALT 19  --   --   ALKPHOS 82  --   --   BILITOT 1.3*  --   --   < > = values in this interval not displayed. ------------------------------------------------------------------------------------------------------------------  Cardiac Enzymes  Recent Labs Lab 04/08/15 0443  TROPONINI 0.03   ------------------------------------------------------------------------------------------------------------------  RADIOLOGY:  Dg Chest Port 1 View  04/09/2015   CLINICAL DATA:  Respiratory crackles.  EXAM: PORTABLE CHEST - 1 VIEW  COMPARISON:  03/16/2014  FINDINGS: There is unchanged  moderate cardiomegaly. The lungs are clear. There is no large effusion. Pulmonary vasculature is normal.  IMPRESSION: Cardiomegaly.  No acute cardiopulmonary findings.   Electronically Signed   By: Andreas Newport M.D.   On: 04/09/2015 01:16    EKG:  No orders found for this or any previous visit.  ASSESSMENT AND  PLAN:   Active Problems:  Cellulitis  1. Cellulitis of the left lower extremity with peripheral vascular disease and ulceration. -Recent angiogram and stent placement of the leg. -Patient placed on aggressive antibiotics vancomycin and Zosyn. Can discontinue vancomycin today. - change to oral ABX likely tomorrow -Ultrasound the left lower extremity negative  2.  Relative hypotension -As per family, the patient's blood pressure is always on the lower side. Monitor  3. Vascular dementia -Seroquel at night Also on plavix  4. PVD- on plavix and recent stent to Left leg.  5. Hypokalemia- Being replaced  6. CODE STATUS - DO NOT RESUSCITATE.  Physical Therapy recommended rehab. Possible discharge tomorrow or the day after     All the records are reviewed and case discussed with Care Management/Social Workerr. Management plans discussed with the patient, family and they are in agreement.  CODE STATUS: DNR  TOTAL TIME TAKING CARE OF THIS PATIENT: 32 minutes.   POSSIBLE D/C IN 1-2 DAYS, DEPENDING ON CLINICAL CONDITION TO Galvin Proffer M.D on 04/10/2015 at 11:18 AM  Between 7am to 6pm - Pager - [phone]  After 6pm go to [email or web address] - password [email or web address] [facility]  Office  [phone]  CC: Primary care physician; Marden Noble, MD

## 2015-04-10 NOTE — Progress Notes (Signed)
Notified Dr. Tressia Miners of pt not swallowing pills, will order IV.

## 2015-04-11 ENCOUNTER — Ambulatory Visit: Payer: Medicare Other | Admitting: Surgery

## 2015-04-11 LAB — BASIC METABOLIC PANEL
ANION GAP: 9 (ref 5–15)
BUN: 13 mg/dL (ref 6–20)
CO2: 28 mmol/L (ref 22–32)
Calcium: 8.8 mg/dL — ABNORMAL LOW (ref 8.9–10.3)
Chloride: 104 mmol/L (ref 101–111)
Creatinine, Ser: 0.74 mg/dL (ref 0.44–1.00)
GFR calc Af Amer: >60 mL/min (ref >60)
GLUCOSE: 89 mg/dL (ref 65–99)
Potassium: 3.9 mmol/L (ref 3.5–5.1)
SODIUM: 141 mmol/L (ref 135–145)

## 2015-04-11 MED ORDER — DOXYCYCLINE HYCLATE 50 MG PO CAPS: 100.0000 mg | ORAL_CAPSULE | Freq: Two times a day (BID) | ORAL | Status: AC

## 2015-04-11 MED ORDER — DOXYCYCLINE HYCLATE 100 MG PO TABS
100.0000 mg | ORAL_TABLET | Freq: Two times a day (BID) | ORAL | Status: DC
  Administered 2015-04-11 – 2015-04-12 (×3): 100 mg via ORAL
  Filled 2015-04-11 (×6): qty 1

## 2015-04-11 MED ORDER — FUROSEMIDE 20 MG PO TABS
20.0000 mg | ORAL_TABLET | Freq: Once | ORAL | Status: AC
  Administered 2015-04-11: 20 mg via ORAL
  Filled 2015-04-11: qty 1

## 2015-04-11 MED ORDER — IPRATROPIUM-ALBUTEROL 0.5-2.5 (3) MG/3ML IN SOLN
3.0000 mL | RESPIRATORY_TRACT | Status: DC
  Administered 2015-04-11 – 2015-04-12 (×7): 3 mL via RESPIRATORY_TRACT
  Filled 2015-04-11 (×8): qty 3

## 2015-04-11 MED ORDER — SENNOSIDES-DOCUSATE SODIUM 8.6-50 MG PO TABS: 1.0000 | ORAL_TABLET | Freq: Every evening | ORAL | Status: AC | PRN

## 2015-04-11 MED ORDER — METHYLPREDNISOLONE SODIUM SUCC 40 MG IJ SOLR
40.0000 mg | INTRAMUSCULAR | Status: AC
  Administered 2015-04-11: 40 mg via INTRAVENOUS
  Filled 2015-04-11: qty 1

## 2015-04-11 MED ORDER — PREDNISONE 10 MG PO TABS: 50.0000 mg | ORAL_TABLET | Freq: Every day | ORAL | Status: AC

## 2015-04-11 MED ORDER — FUROSEMIDE 20 MG PO TABS: 20.0000 mg | ORAL_TABLET | Freq: Every day | ORAL | Status: AC

## 2015-04-11 MED ORDER — QUETIAPINE FUMARATE 25 MG PO TABS: 12.5000 mg | ORAL_TABLET | Freq: Every day | ORAL | Status: DC

## 2015-04-11 MED ORDER — RISPERIDONE 0.5 MG PO TBDP
0.5000 mg | ORAL_TABLET | Freq: Two times a day (BID) | ORAL | Status: DC | PRN
  Filled 2015-04-11: qty 1

## 2015-04-11 MED ORDER — ALUM & MAG HYDROXIDE-SIMETH 200-200-20 MG/5ML PO SUSP: 30.0000 mL | Freq: Four times a day (QID) | ORAL | Status: AC | PRN

## 2015-04-11 MED ORDER — COLLAGENASE 250 UNIT/GM EX OINT: TOPICAL_OINTMENT | Freq: Every day | CUTANEOUS | Status: AC

## 2015-04-11 MED ORDER — IPRATROPIUM-ALBUTEROL 0.5-2.5 (3) MG/3ML IN SOLN: 3.0000 mL | RESPIRATORY_TRACT | Status: AC | PRN

## 2015-04-11 MED ORDER — ACETAMINOPHEN 325 MG PO TABS: 650.0000 mg | ORAL_TABLET | Freq: Four times a day (QID) | ORAL | Status: AC | PRN

## 2015-04-11 NOTE — Progress Notes (Signed)
ANTIBIOTIC CONSULT NOTE - FOLLOW UP  Pharmacy Consult for Zosyn  Indication: cellulitis    Allergies  Allergen Reactions  . Codeine Other (See Comments)    Reaction:  Unknown  . Sulfa Antibiotics Other (See Comments)    Reaction:  Unknown    Patient Measurements: Height: 5\' 4"  (162.6 cm) Weight: 147 lb 12.8 oz (67.042 kg) IBW/kg (Calculated) : 54.7 Adjusted Body Weight:   Vital Signs: Temp: 98.6 F (37 C) (05/06 0747) Temp Source: Oral (05/06 0747) BP: 87/61 mmHg (05/06 0747) Pulse Rate: 75 (05/06 0747) Intake/Output from previous day: 05/05 0701 - 05/06 0700 In: 886 [P.O.:660; IV Piggyback:226] Out: 900 [Urine:900] Intake/Output from this shift: Total I/O In: 0  Out: 250 [Urine:250]  Labs:  Recent Labs  04/09/15 0425 04/10/15 0451 04/11/15 0412  PLT  --  174  --   CREATININE 0.86 0.76 0.74   Estimated Creatinine Clearance: 45.7 mL/min (by C-G formula based on Cr of 0.74).  Recent Labs  04/09/15 2145  [facility] 10     Microbiology: Recent Results (from the past 720 hour(s))  Culture, blood (routine x 2)     Status: None (Preliminary result)   Collection Time: 04/07/15  1:40 PM  Result Value Ref Range Status   Specimen Description BLOOD  Final   Special Requests BLOOD  Final   Culture NO GROWTH 3 DAYS  Final   Report Status PENDING  Incomplete  Culture, blood (routine x 2)     Status: None (Preliminary result)   Collection Time: 04/07/15  1:45 PM  Result Value Ref Range Status   Specimen Description BLOOD  Final   Special Requests BLOOD  Final   Culture NO GROWTH 3 DAYS  Final   Report Status PENDING  Incomplete    Anti-infectives    Start     Dose/Rate Route Frequency Ordered Stop   04/07/15 2230  vancomycin (VANCOCIN) IVPB 750 mg/150 ml premix  Status:  Discontinued     750 mg 150 mL/hr over 60 Minutes Intravenous Every 24 hours 04/07/15 1653 04/10/15 1126   04/07/15 1800  piperacillin-tazobactam (ZOSYN) IVPB 3.375 g  Status:  Discontinued      3.375 g 100 mL/hr over 30 Minutes Intravenous 4 times per day 04/07/15 1611 04/07/15 1704   04/07/15 1715  piperacillin-tazobactam (ZOSYN) IVPB 3.375 g     3.375 g 12.5 mL/hr over 240 Minutes Intravenous 3 times per day 04/07/15 1704     04/07/15 1345  vancomycin (VANCOCIN) IVPB 1000 mg/200 mL premix     1,000 mg 200 mL/hr over 60 Minutes Intravenous  Once 04/07/15 1330 04/07/15 1516   04/07/15 1330  cefTRIAXone (ROCEPHIN) 1 g in dextrose 5 % 50 mL IVPB  Status:  Discontinued     1 g 100 mL/hr over 30 Minutes Intravenous  Once 04/07/15 1330 04/07/15 1448      Assessment: Empiric therapy for cellulitis. Previously on Vancomycin and Zosyn. Vanc d/c'd    Goal of Therapy:  Resolution of infection  Plan:  Follow up culture results  MD considering changing to PO antimicrobials   Orrie Schubert D 04/11/2015,8:42 AM

## 2015-04-11 NOTE — Clinical Social Work Note (Signed)
Physician has discharged patient for today. When CSW went to ask patient's daughter which facility she and her brother have chosen, she stated her brother is looking at two of the facilities today. I informed her that due to patient having discharge orders in, that a decision would be needed as soon as possible.  Shela Leff MSW,LCSWA [phone]

## 2015-04-11 NOTE — Plan of Care (Signed)
Problem: Phase III Progression Outcomes Goal: Other Phase III Outcomes/Goals Outcome: Not Applicable Date Met:  97/74/14 No additional Phase Outcome/Goals identified at this time.

## 2015-04-11 NOTE — Progress Notes (Signed)
Physical Therapy Treatment Patient Details Name: Alexandria Roberts MRN: [id number] DOB: 11/22/1924 Today's Date: 04/11/2015    History of Present Illness presensted to ER with progressive fatigue, weakness; admitted with L LE cellulitis.  Of note, patient status post L LE stenting and angioplasty the week prior to admission.    PT Comments    Gradual progression towards mobility goals.  Continues to require mod/max assist +1-2 with RW for all mobility.  Limited carry-over of education/training within and between session.  Follow Up Recommendations  SNF     Equipment Recommendations  Rolling walker with 5" wheels;3in1 (PT)    Recommendations for Other Services       Precautions / Restrictions Precautions Precautions: Fall Restrictions Weight Bearing Restrictions: No    Mobility  Bed Mobility Overal bed mobility: Needs Assistance Bed Mobility: Supine to Sit     Supine to sit: Mod assist     General bed mobility comments: transition towards L.  Improved ability to actively assist with movement transition this date  Transfers Overall transfer level: Needs assistance Equipment used: Rolling walker (2 wheeled) Transfers: Sit to/from Stand Sit to Stand: Mod assist;Max assist         General transfer comment: assist for hand/foot placement, lift off and dynamic balance  Ambulation/Gait Ambulation/Gait assistance: Mod assist;+2 physical assistance;Max assist Ambulation Distance (Feet): 10 Feet Assistive device: Rolling walker (2 wheeled)       General Gait Details: short, shuffling steps; posterior trunk lean/weight shift.  Requires manual weight shifting from therapist to initiate advancement of each LE.  Continues with very poor balance.  Unable to tolerate additional activity due to fatigue.   Stairs            Wheelchair Mobility    Modified Rankin (Stroke Patients Only)       Balance Overall balance assessment: Needs assistance Sitting-balance  support: No upper extremity supported Sitting balance-Leahy Scale: Fair Sitting balance - Comments: initially requiring mod/max assist for sitting balance; close sup once positioned in midline.  Frequently attempting to return to supine "to rest a little bit"   Standing balance support: Bilateral upper extremity supported Standing balance-Leahy Scale: Poor Standing balance comment: mod/max assist +1-2 for posterior weight shift                    Cognition Arousal/Alertness: Awake/alert Behavior During Therapy: WFL for tasks assessed/performed Overall Cognitive Status: History of cognitive impairments - at baseline       Memory: Decreased short-term memory              Exercises Other Exercises Other Exercises: Sit/stand from edge of bed and recliner with RW, mod/max assist +1-2 for hand placement, lift off and anterior weight translation.  Constant assist, mod/max +1, for standing balance.  Progressive posterior weight shift as fatigue increases. Other Exercises: Supported standing at sink with RW, mod/max assist +1--using R UE to complete dynamic reaching (modified D2 extension) tasks related to ADL/grooming tasks.  Improved anterior weight translation with stable surface (i.e., counter) anterior to patient.  Fatigues quickly and requires seat after 1-2 min of standing activity.    General Comments        Pertinent Vitals/Pain Pain Assessment: No/denies pain    Home Living                      Prior Function            PT Goals (current goals can now be found  in the care plan section) Acute Rehab PT Goals Patient Stated Goal: unable to verbalize PT Goal Formulation: With patient/family Time For Goal Achievement: 04/22/15 Additional Goals Additional Goal #1: Patient will complete all bed mobility indep for pulmonary hygiene and self-repositioning. Additional Goal #2: Patient to complete all basic transfers with LRAD, min assist, for access to all  seating surfaces in home environment. Progress towards PT goals: Progressing toward goals    Frequency  Min 2X/week    PT Plan      Co-evaluation             End of Session Equipment Utilized During Treatment: Gait belt Activity Tolerance: Patient limited by fatigue Patient left: in chair;with call bell/phone within reach;with chair alarm set;with family/visitor present     Time: 1194-1740 PT Time Calculation (min) (ACUTE ONLY): 35 min  Charges:  $Gait Training: 8-22 mins $Therapeutic Activity: 8-22 mins                    G Codes:      Cinnamon Morency H. Owens Shark, PT, DPT 04/11/2015 11:25 AM [phone]   Ellison Hughs 04/11/2015, 11:23 AM

## 2015-04-11 NOTE — Plan of Care (Signed)
Problem: Phase I Progression Outcomes Goal: Other Phase I Outcomes/Goals Outcome: Not Applicable Date Met:  04/11/15 No additional Phase Outcome/Goals identified at this time.        

## 2015-04-11 NOTE — Progress Notes (Addendum)
Patient ID: Alexandria Roberts, female   DOB: 1924/07/12, 81 y.o.   MRN: [id number] [facility] [place]    MR#:  [id number]  DATE OF BIRTH:  [birth year]  SUBJECTIVE:  CHIEF COMPLAINT:   Chief Complaint  Patient presents with  . Leg Pain  Daughter at bedside, some resp difficulty this am. Didn't sleep well at all last night. Pt is pleasant and oriented and at baseline   REVIEW OF SYSTEMS:  CONSTITUTIONAL: No fever, fatigue or weakness.  EYES: No blurred or double vision.  EARS, NOSE, AND THROAT: No tinnitus or ear pain.  RESPIRATORY: DIFFICULTY BREATHING NOTED CARDIOVASCULAR: No chest pain, orthopnea, edema.  GASTROINTESTINAL: No nausea, vomiting, diarrhea or abdominal pain.  GENITOURINARY: No dysuria, hematuria.  ENDOCRINE: No polyuria, nocturia,  HEMATOLOGY: No anemia, easy bruising or bleeding SKIN: No rash or lesion. MUSCULOSKELETAL: No joint pain or arthritis.  Left leg pain NEUROLOGIC: No tingling, numbness, weakness.  PSYCHIATRY: No anxiety or depression.   DRUG ALLERGIES:   Allergies  Allergen Reactions  . Codeine Other (See Comments)    Reaction:  Unknown  . Sulfa Antibiotics Other (See Comments)    Reaction:  Unknown    VITALS:  Blood pressure 87/61, pulse 75, temperature 98.6 F (37 C), temperature source Oral, resp. rate 18, height 5\' 4"  (1.626 m), weight 67.042 kg (147 lb 12.8 oz), SpO2 96 %.  PHYSICAL EXAMINATION:   GENERAL:  81 y.o.-year-old patient lying in the bed with no acute distress.  EYES: Pupils equal, round, reactive to light and accommodation. No scleral icterus. Extraocular muscles intact.  HEENT: Head atraumatic, normocephalic. Oropharynx and nasopharynx clear.  NECK:  Supple, no jugular venous distention. No thyroid enlargement, no tenderness.  LUNGS: Normal breath sounds bilaterally, scattered wheezing, fine rales at bases noted. No rhonchi. No use of accessory muscles of  respiration.  CARDIOVASCULAR: S1, S2 normal. No murmurs, rubs, or gallops.  ABDOMEN: Soft, nontender, nondistended. Bowel sounds present. No organomegaly or mass.  EXTREMITIES: Left lower extremity is swollen, erythematous still- better than yesterday. No pedal edema, cyanosis, or clubbing.  NEUROLOGIC: Cranial nerves II through XII are intact. Muscle strength 5/5 in all extremities. Sensation intact. Gait not checked.  PSYCHIATRIC: The patient is sleepy, arousable and oriented x 2. Very pleasant SKIN: No obvious rash, lesion, or ulcer.    LABORATORY PANEL:   CBC  Recent Labs Lab 04/08/15 0443 04/10/15 0451  WBC 6.9  --   HGB 12.4  --   HCT 37.3  --   PLT 143* 174   ------------------------------------------------------------------------------------------------------------------  Chemistries   Recent Labs Lab 04/08/15 0443  04/11/15 0412  NA 138  < > 141  K 3.8  < > 3.9  CL 101  < > 104  CO2 29  < > 28  GLUCOSE 101*  < > 89  BUN 18  < > 13  CREATININE 0.90  < > 0.74  CALCIUM 8.4*  < > 8.8*  AST 31  --   --   ALT 19  --   --   ALKPHOS 82  --   --   BILITOT 1.3*  --   --   < > = values in this interval not displayed. ------------------------------------------------------------------------------------------------------------------  Cardiac Enzymes  Recent Labs Lab 04/08/15 0443  TROPONINI 0.03   ------------------------------------------------------------------------------------------------------------------  RADIOLOGY:  No results found.  EKG:  No orders found for this or any previous visit.  ASSESSMENT AND PLAN:  Active Problems:  Cellulitis  1. Cellulitis of the left lower extremity with peripheral vascular disease and ulceration. -Recent angiogram and stent placement of the leg. -Patient placed on aggressive antibiotics vancomycin and Zosyn. Vancomycin discontinued Will change zosyn to doxycycline today -Ultrasound the left lower extremity  negative  2.  Relative hypotension -As per family, the patient's blood pressure is always on the lower side. Monitor  3. Vascular dementia -Seroquel at night Also on plavix  4. PVD- on plavix and recent stent to Left leg. Appreciate vascular consult  5. COPD- with minimal wheezing today, added duonebs and lasix one dose and solumedrol Will discharge on prednisone taper  5. Hypokalemia- replaced  6. CODE STATUS - DO NOT RESUSCITATE.  Physical Therapy recommended rehab.     All the records are reviewed and case discussed with Care Management/Social Workerr. Management plans discussed with the patient, family and they are in agreement.  CODE STATUS: DNR  TOTAL TIME TAKING CARE OF THIS PATIENT: 37 minutes.   POSSIBLE D/C TODAY, DEPENDING ON CLINICAL CONDITION TO Galvin Proffer M.D on 04/11/2015 at 10:29 AM  Between 7am to 6pm - Pager - [phone]  After 6pm go to [email or web address] - password [email or web address] [facility]  Office  [phone]  CC: Primary care physician; Marden Noble, MD   ADDENDUM: Pts son arrived to hospital now and says that pt has been hallucinating and sundowning at night, he feels not ready for her discharge today. Will discontinue her seroquel as it was started in hospital. Will add risperidone prn bid Explained that she might be discharged tomorrow. Bed ready at [facility]

## 2015-04-11 NOTE — Plan of Care (Signed)
Problem: Phase II Progression Outcomes Goal: Other Phase II Outcomes/Goals Outcome: Not Applicable Date Met:  11/29/82 No additional Phase Outcome/Goals identified at this time.

## 2015-04-11 NOTE — Discharge Instructions (Addendum)
°  DIET:  °Cardiac diet ° °DISCHARGE CONDITION:  °Stable ° °ACTIVITY:  °Activity as tolerated ° °OXYGEN:  °Home Oxygen: No. °  °Oxygen Delivery: room air ° °DISCHARGE LOCATION:  °nursing home  ° °If you experience worsening of your admission symptoms, develop shortness of breath, life threatening emergency, suicidal or homicidal thoughts you must seek medical attention immediately by calling 911 or calling your MD immediately  if symptoms less severe. ° °You Must read complete instructions/literature along with all the possible adverse reactions/side effects for all the Medicines you take and that have been prescribed to you. Take any new Medicines after you have completely understood and accpet all the possible adverse reactions/side effects.  ° °Please note ° °You were cared for by a hospitalist during your hospital stay. If you have any questions about your discharge medications or the care you received while you were in the hospital after you are discharged, you can call the unit and asked to speak with the hospitalist on call if the hospitalist that took care of you is not available. Once you are discharged, your primary care physician will handle any further medical issues. Please note that NO REFILLS for any discharge medications will be authorized once you are discharged, as it is imperative that you return to your primary care physician (or establish a relationship with a primary care physician if you do not have one) for your aftercare needs so that they can reassess your need for medications and monitor your lab values. ° ° ° °

## 2015-04-11 NOTE — Progress Notes (Signed)
Patient to discharge to SNF today.

## 2015-04-11 NOTE — Clinical Social Work Note (Signed)
Patient's son arrived to hospital at 76 to inform CSW that they have chosen [facility] and to also state he did not want his mother discharged today and wanted to speak with the physician. CSW paged the physician and the physician came to talk with patient's son and the discharge has been postponed. CSW made [facility] aware and fowarded the discharge summary to Hilda Blades at [facility] today. CSW also put Hilda Blades in touch with family in order to arrange a time for admission paperwork.  Shela Leff MSW,LCSWA [phone]

## 2015-04-12 LAB — CULTURE, BLOOD (ROUTINE X 2)
CULTURE: NO GROWTH
Culture: NO GROWTH

## 2015-04-12 MED ORDER — RISPERIDONE 0.5 MG PO TBDP: 0.5000 mg | ORAL_TABLET | Freq: Two times a day (BID) | ORAL | Status: AC | PRN

## 2015-04-12 NOTE — Progress Notes (Signed)
Pt for d/c to [facility] today via EMS. Pt's son agreeable to d/c after speaking with MD. Packet complete and dc summary faxed to [facility]. RN to call report. CSW signing off. Wandra Feinstein, MSW, LCSW [phone] (weekend coverage)

## 2015-04-12 NOTE — Discharge Summary (Signed)
[place], 81 y.o., DOB 1924-01-10, MRN [id number]. Admission date: 04/07/2015 Discharge Date 04/12/2015 Primary MD Marden Noble, MD Admitting Physician Bettey Costa, MD  Admission Diagnosis  Edema [R60.9] Cellulitis of leg, left [L03.116]  Discharge Diagnosis   Active Problems:   Cellulitis   Acute delirium  Past Medical History  Diagnosis Date  . COPD (chronic obstructive pulmonary disease)   . Heart attack   . Breast cancer   . Atrial fibrillation     Past Surgical History  Procedure Laterality Date  . Breast surgery Left   . Angioplasty    . Vaginal hysterectomy        Hospital Course See H&P, Labs, Consult and Test reports for all details in brief, patient was admitted for Left leg cellulitis, altered mental status. Patient was started on aggressive IV antibiotics,with improvement in her left leg cellulitis. Patient also was having hallucinations during hospitalization and was started on Seroquel,after initiation of Seroquel she started to get more confused and was very sleepy therefore discharge was held. Seroquel was discontinued. Patient's mental status is much improved and is doing well. She is very weak and debilitated and deconditioned therefore arrangements for nursing home have been made. Family is in agreement for d/c home    Consults  vascular surgery  Significant Tests:  See full reports for all details      US Venous Img Lower Unilateral Left  04/07/2015   CLINICAL DATA:  Arterial stent placement Thursday. Edema since Saturday. History of DVT ,breast carcinoma. On Plavix.  EXAM: LEFT LOWER EXTREMITY VENOUS DOPPLER ULTRASOUND  TECHNIQUE: Gray-scale sonography with compression, as well as color and duplex ultrasound, were performed to evaluate the deep venous system from the level of the common femoral vein through the popliteal and proximal calf veins.  COMPARISON:  None  FINDINGS: Normal compressibility of the common femoral, superficial femoral, and popliteal  veins, as well as the proximal calf veins. No filling defects to suggest DVT on grayscale or color Doppler imaging. Doppler waveforms show normal direction of venous flow, normal respiratory phasicity and response to augmentation. There is a 46 x 24 x 35 mm fluid collection in the posterior popliteal fossa. Survey views of the contralateral common femoral vein are unremarkable.  IMPRESSION: 1. No evidence of lower extremity deep vein thrombosis, LEFT. 2. Left Baker's cyst.   Electronically Signed   By: Lucrezia Europe M.D.   On: 04/07/2015 15:43   Dg Chest Port 1 View  04/09/2015   CLINICAL DATA:  Respiratory crackles.  EXAM: PORTABLE CHEST - 1 VIEW  COMPARISON:  03/16/2014  FINDINGS: There is unchanged moderate cardiomegaly. The lungs are clear. There is no large effusion. Pulmonary vasculature is normal.  IMPRESSION: Cardiomegaly.  No acute cardiopulmonary findings.   Electronically Signed   By: Andreas Newport M.D.   On: 04/09/2015 01:16       Today   Subjective:   Arlana Hove today has no headache,no chest abdominal pain,no new weakness tingling or numbness, doing much better son at bedside. Mental status much improved today.     Objective:   Blood pressure 90/54, pulse 71, temperature 97.8 F (36.6 C), temperature source Oral, resp. rate 17, height 5\' 4"  (1.626 m), weight 67.042 kg (147 lb 12.8 oz), SpO2 97 %.  .  Intake/Output Summary (Last 24 hours) at 04/12/15 1044 Last data filed at 04/12/15 0802  Gross per 24 hour  Intake    360 ml  Output   1200 ml  Net   -840 ml    Exam Awake Alert, Oriented *3, No new F.N deficits, Normal affect [place].AT,PERRAL Supple Neck,No JVD, No cervical lymphadenopathy appreciated.  Symmetrical Chest wall movement, Good air movement bilaterally, CTAB RRR,No Gallops,Rubs or new Murmurs, No Parasternal Heave  +ve B.Sounds, Abd Soft, Non tender, No organomegaly appriciated, No rebound -guarding or rigidity. No Cyanosis, Clubbing or edema, No new Rash or  bruise Ext: erythema improved in lower ext Neuro: not anxious or depressed  Data Review   Cultures -   CBC w Diff: Lab Results  Component Value Date   WBC 6.9 04/08/2015   WBC 6.7 03/15/2014   HGB 12.4 04/08/2015   HGB 15.0 03/15/2014   HCT 37.3 04/08/2015   HCT 47.8* 03/15/2014   PLT 174 04/10/2015   PLT 150 03/15/2014   LYMPHOPCT 9% 04/07/2015   LYMPHOPCT 20.3 03/15/2014   MONOPCT 17% 04/07/2015   MONOPCT 11.9 03/15/2014   EOSPCT 0% 04/07/2015   EOSPCT 1.9 03/15/2014   BASOPCT 1% 04/07/2015   BASOPCT 1.0 03/15/2014   CMP: Lab Results  Component Value Date   NA 141 04/11/2015   NA 141 03/15/2014   K 3.9 04/11/2015   K 4.5 03/15/2014   CL 104 04/11/2015   CL 106 03/15/2014   CO2 28 04/11/2015   CO2 35* 03/15/2014   BUN 13 04/11/2015   BUN 18 04/03/2015   CREATININE 0.74 04/11/2015   CREATININE 0.95 04/03/2015   PROT 6.4* 04/08/2015   PROT 7.3 12/25/2011   ALBUMIN 2.7* 04/08/2015   ALBUMIN 3.5 12/25/2011   BILITOT 1.3* 04/08/2015   ALKPHOS 82 04/08/2015   ALKPHOS 61 12/25/2011   AST 31 04/08/2015   AST 60* 12/25/2011   ALT 19 04/08/2015   ALT 45 12/25/2011  .  Micro Results Recent Results (from the past 240 hour(s))  Culture, blood (routine x 2)     Status: None (Preliminary result)   Collection Time: 04/07/15  1:40 PM  Result Value Ref Range Status   Specimen Description BLOOD  Final   Special Requests BLOOD  Final   Culture NO GROWTH 4 DAYS  Final   Report Status PENDING  Incomplete  Culture, blood (routine x 2)     Status: None (Preliminary result)   Collection Time: 04/07/15  1:45 PM  Result Value Ref Range Status   Specimen Description BLOOD  Final   Special Requests BLOOD  Final   Culture NO GROWTH 4 DAYS  Final   Report Status PENDING  Incomplete     Discharge Instructions      Follow-up Information    Follow up with Marden Noble, MD On 04/16/2015.   Specialty:  Internal Medicine   Why:  at 10:30a.m   Contact information:    [address], [place] [address] [phone]       Follow up with Delana Meyer, Dolores Lory, MD. Go on 04/17/2015.   Specialties:  Vascular Surgery, Interventional Cardiology, Radiology   Why:  at 10:30am for hospital follow-up   Contact information:   [place] [address] [phone]       Discharge Medications     Medication List    STOP taking these medications        carvedilol 3.125 MG tablet  Commonly known as:  COREG     KLOR-CON M20 20 MEQ tablet  Generic drug:  potassium chloride SA     levofloxacin 500 MG tablet  Commonly known as:  LEVAQUIN     torsemide 20 MG tablet  Commonly known as:  DEMADEX      TAKE these medications        acetaminophen 325 MG tablet  Commonly known as:  TYLENOL  Take 2 tablets (650 mg total) by mouth every 6 (six) hours as needed for mild pain (or Fever >/= 101).     alum & mag hydroxide-simeth 200-200-20 MG/5ML suspension  Commonly known as:  MAALOX/MYLANTA  Take 30 mLs by mouth every 6 (six) hours as needed for indigestion or heartburn (dyspepsia).     clopidogrel 75 MG tablet  Commonly known as:  PLAVIX  Take 75 mg by mouth every morning.     collagenase ointment  Commonly known as:  SANTYL  Apply topically daily.     doxycycline 50 MG capsule  Commonly known as:  VIBRAMYCIN  Take 2 capsules (100 mg total) by mouth 2 (two) times daily.     furosemide 20 MG tablet  Commonly known as:  LASIX  Take 1 tablet (20 mg total) by mouth daily.     ipratropium-albuterol 0.5-2.5 (3) MG/3ML Soln  Commonly known as:  DUONEB  Take 3 mLs by nebulization every 4 (four) hours as needed.     MULTIVITAMIN ADULT PO  Take 1 tablet by mouth every morning.     predniSONE 10 MG tablet  Commonly known as:  DELTASONE  Take 5 tablets (50 mg total) by mouth daily with breakfast.     risperiDONE 0.5 MG disintegrating tablet  Commonly known as:  RISPERDAL M-TABS  Take 1 tablet (0.5 mg total) by mouth 2 (two)  times daily as needed (for agitation, hallucinations).     senna-docusate 8.6-50 MG per tablet  Commonly known as:  Senokot-S  Take 1 tablet by mouth at bedtime as needed for mild constipation.         Total Time in preparing paper work, data evaluation and todays exam - 35 minutes  Dustin Flock M.D on 04/12/2015 at 10:44 AM  [facility]   Office  [phone]

## 2015-05-23 NOTE — Progress Notes (Signed)
Alexandria Roberts ([id number]) Visit Report for 03/28/2015 Arrival Information Details Patient Name: Alexandria Roberts, Alexandria Roberts Date of Service: 03/28/2015 1:00 PM Medical Record Number: [id number] Patient Account Number: [id number] Date of Birth/Sex: 23-Feb-1924 (81 y.o. Female) Treating RN: Alexandria Roberts Primary Care Physician: Alexandria Roberts Other Clinician: Referring Physician: Nicky Roberts Treating Physician/Extender: Alexandria Roberts in Treatment: 34 Visit Information History Since Last Visit Added or deleted any medications: No Patient Arrived: Walker Any new allergies or adverse reactions: No Arrival Time: 13:11 Had a fall or experienced change in No Accompanied By: son and activities of daily living that may affect dtr risk of falls: Transfer Assistance: None Signs or symptoms of abuse/neglect since last No Patient Identification Verified: Yes visito Secondary Verification Process Yes Hospitalized since last visit: No Completed: Pain Present Now: No Patient Has Alerts: Yes Electronic Signature(s) Signed: 03/28/2015 3:20:42 PM By: Alexandria Roberts Entered By: Alexandria Roberts on 03/28/2015 13:12:11 Alexandria Roberts ([id number]) -------------------------------------------------------------------------------- Clinic Level of Care Assessment Details Patient Name: Alexandria Roberts Date of Service: 03/28/2015 1:00 PM Medical Record Number: [id number] Patient Account Number: [id number] Date of Birth/Sex: [birth year] (81 y.o. Female) Treating RN: Alexandria Roberts Primary Care Physician: Alexandria Roberts Other Clinician: Referring Physician: Nicky Roberts Treating Physician/Extender: Alexandria Roberts in Treatment: 17 Clinic Level of Care Assessment Items TOOL 4 Quantity Score []  - Use when only an EandM is performed on FOLLOW-UP visit 0 ASSESSMENTS - Nursing Assessment / Reassessment X - Reassessment of Co-morbidities (includes updates in patient status) 1 10 X - Reassessment of Adherence to  Treatment Plan 1 5 ASSESSMENTS - Wound and Skin Assessment / Reassessment []  - Simple Wound Assessment / Reassessment - one wound 0 X - Complex Wound Assessment / Reassessment - multiple wounds 5 5 []  - Dermatologic / Skin Assessment (not related to wound area) 0 ASSESSMENTS - Focused Assessment X - Circumferential Edema Measurements - multi extremities 2 5 []  - Nutritional Assessment / Counseling / Intervention 0 []  - Lower Extremity Assessment (monofilament, tuning fork, pulses) 0 X - Peripheral Arterial Disease Assessment (using hand held doppler) 1 10 ASSESSMENTS - Ostomy and/or Continence Assessment and Care []  - Incontinence Assessment and Management 0 []  - Ostomy Care Assessment and Management (repouching, etc.) 0 PROCESS - Coordination of Care X - Simple Patient / Family Education for ongoing care 1 15 []  - Complex (extensive) Patient / Family Education for ongoing care 0 []  - Staff obtains Programmer, systems, Records, Test Results / Process Orders 0 []  - Staff telephones HHA, Nursing Homes / Clarify orders / etc 0 []  - Routine Transfer to another Facility (non-emergent condition) 0 Alexandria Roberts ([id number]) []  - Routine Hospital Admission (non-emergent condition) 0 []  - New Admissions / Biomedical engineer / Ordering NPWT, Apligraf, etc. 0 []  - Emergency Hospital Admission (emergent condition) 0 X - Simple Discharge Coordination 1 10 []  - Complex (extensive) Discharge Coordination 0 PROCESS - Special Needs []  - Pediatric / Minor Patient Management 0 []  - Isolation Patient Management 0 []  - Hearing / Language / Visual special needs 0 []  - Assessment of Community assistance (transportation, D/C planning, etc.) 0 []  - Additional assistance / Altered mentation 0 []  - Support Surface(s) Assessment (bed, cushion, seat, etc.) 0 INTERVENTIONS - Wound Cleansing / Measurement []  - Simple Wound Cleansing - one wound 0 X - Complex Wound Cleansing - multiple wounds 5 5 X - Wound Imaging  (photographs - any number of wounds) 1 5 []  - Wound Tracing (instead of photographs) 0 []  - Simple Wound Measurement - one wound  0 X - Complex Wound Measurement - multiple wounds 5 5 INTERVENTIONS - Wound Dressings X - Small Wound Dressing one or multiple wounds 5 10 []  - Medium Wound Dressing one or multiple wounds 0 []  - Large Wound Dressing one or multiple wounds 0 []  - Application of Medications - topical 0 []  - Application of Medications - injection 0 INTERVENTIONS - Miscellaneous []  - External ear exam 0 Bille, Ryanna ([id number]) []  - Specimen Collection (cultures, biopsies, blood, body fluids, etc.) 0 []  - Specimen(s) / Culture(s) sent or taken to Lab for analysis 0 []  - Patient Transfer (multiple staff / Harrel Lemon Lift / Similar devices) 0 []  - Simple Staple / Suture removal (25 or less) 0 []  - Complex Staple / Suture removal (26 or more) 0 []  - Hypo / Hyperglycemic Management (close monitor of Blood Glucose) 0 []  - Ankle / Brachial Index (ABI) - do not check if billed separately 0 X - Vital Signs 1 5 Has the patient been seen at the hospital within the last three years: Yes Total Score: 195 Level Of Care: New/Established - Level 5 Electronic Signature(s) Signed: 03/28/2015 3:20:42 PM By: Alexandria Roberts Entered By: Alexandria Roberts on 03/28/2015 14:02:48 Alexandria Roberts ([id number]) -------------------------------------------------------------------------------- Complex / Palliative Patient Assessment Details Patient Name: Alexandria Roberts Date of Service: 03/28/2015 1:00 PM Medical Record Number: [id number] Patient Account Number: [id number] Date of Birth/Sex: Apr 13, 1924 (81 y.o. Female) Treating RN: Alexandria Roberts Primary Care Physician: Alexandria Roberts Other Clinician: Referring Physician: Nicky Roberts Treating Physician/Extender: Alexandria Roberts in Treatment: 17 Palliative Management Criteria Complex Wound Management Criteria Patient requires a surgical procedure in order  to achieve wound healing: Revascularization However, the patient does not wish to undergo the recommended surgical procedure. The patient, the patient's family or care Alexandria Roberts(s), or primary care physician has requested supportive care rather than advanced wound treatment. (Must be documented in physician progress notes.) Care Approach Wound Care Plan: Complex Wound Management Electronic Signature(s) Signed: 04/08/2015 1:45:16 PM By: Christin Fudge MD, FACS Signed: 04/28/2015 2:15:54 PM By: Gretta Cool RN, BSN, Kim RN, BSN Entered By: Gretta Cool, RN, BSN, Kim on 04/08/2015 13:24:36 Alexandria Roberts ([id number]) -------------------------------------------------------------------------------- Encounter Discharge Information Details Patient Name: Alexandria Roberts Date of Service: 03/28/2015 1:00 PM Medical Record Number: [id number] Patient Account Number: [id number] Date of Birth/Sex: March 24, 1924 (81 y.o. Female) Treating RN: Alexandria Roberts Primary Care Physician: Alexandria Roberts Other Clinician: Referring Physician: Nicky Roberts Treating Physician/Extender: Alexandria Roberts in Treatment: 17 Encounter Discharge Information Items Schedule Follow-up Appointment: No Medication Reconciliation completed No and provided to Patient/Care Faydra Korman: Provided on Clinical Summary of Care: 03/28/2015 Form Type Recipient Paper Patient [facility] Electronic Signature(s) Signed: 03/28/2015 2:24:49 PM By: Ruthine Dose Entered By: Ruthine Dose on 03/28/2015 14:24:49 Alexandria Roberts ([id number]) -------------------------------------------------------------------------------- Lower Extremity Assessment Details Patient Name: Alexandria Roberts Date of Service: 03/28/2015 1:00 PM Medical Record Number: [id number] Patient Account Number: [id number] Date of Birth/Sex: 03-22-1924 (81 y.o. Female) Treating RN: Alexandria Roberts Primary Care Physician: Alexandria Roberts Other Clinician: Referring Physician: Nicky Roberts Treating  Physician/Extender: Alexandria Roberts in Treatment: 17 Edema Assessment Assessed: [Left: No] [Right: No] E[Left: dema] [Right: :] Calf Left: Right: Point of Measurement: 37 cm From Medial Instep 34.5 cm 34.5 cm Ankle Left: Right: Point of Measurement: 10 cm From Medial Instep 24.3 cm 24.1 cm Vascular Assessment Pulses: Posterior Tibial Palpable: [Left:No] [Right:No] Doppler: [Left:Monophasic] [Right:Monophasic] Dorsalis Pedis Palpable: [Left:No] [Right:No] Doppler: [Left:Monophasic] [Right:Monophasic] Extremity colors, hair growth, and conditions: Extremity Color: [Left:Red] [Right:Red] Hair Growth on Extremity: [Left:No] [Right:No] Temperature of  Extremity: [Left:Cool] [Right:Cool] Capillary Refill: [Left:< 3 seconds] [Right:< 3 seconds] Toe Nail Assessment Left: Right: Thick: Yes Yes Discolored: Yes Yes Deformed: Yes Yes Improper Length and Hygiene: No No Electronic Signature(s) Signed: 03/28/2015 3:20:42 PM By: Quitman Livings, Earvin Hansen ([id number]) Entered By: Alexandria Roberts on 03/28/2015 13:24:49 Alexandria Roberts ([id number]) -------------------------------------------------------------------------------- Multi Wound Chart Details Patient Name: Alexandria Roberts Date of Service: 03/28/2015 1:00 PM Medical Record Number: [id number] Patient Account Number: [id number] Date of Birth/Sex: Oct 24, 1924 (81 y.o. Female) Treating RN: Alexandria Roberts Primary Care Physician: Alexandria Roberts Other Clinician: Referring Physician: Nicky Roberts Treating Physician/Extender: Alexandria Roberts in Treatment: 17 Vital Signs Height(in): 64 Pulse(bpm): 76 Weight(lbs): 142 Blood Pressure 77/54 (mmHg): Body Mass Index(BMI): 24 Temperature(F): 97.2 Respiratory Rate 18 (breaths/min): Photos: [10:No Photos] [2:No Photos] [4:No Photos] Wound Location: [10:Right Elbow] [2:Left, Posterior Lower Leg] [4:Left, Lateral Lower Leg] Wounding Event: [10:Trauma] [2:Blister]  [4:Blister] Primary Etiology: [10:Skin Tear] [2:Venous Leg Ulcer] [4:Arterial Insufficiency Ulcer] Date Acquired: [10:03/26/2015] [2:10/07/2014] [4:02/10/2015] Weeks of Treatment: [10:0] [2:17] [4:6] Wound Status: [10:Open] [2:Open] [4:Open] Measurements L x W x D 1.4x0.5x0.1 [2:1.1x1x0.2] [4:1.1x0.2x0.1] (cm) Area (cm) : [10:0.55] [2:0.864] [4:0.173] Volume (cm) : [10:0.055] [2:0.173] [4:0.017] % Reduction in Area: [10:N/A] [2:95.90%] [4:8.00%] % Reduction in Volume: N/A [2:97.20%] [4:10.50%] Classification: [10:N/A] [2:Partial Thickness] [4:Partial Thickness] Periwound Skin Texture: No Abnormalities Noted [2:No Abnormalities Noted] [4:No Abnormalities Noted] Periwound Skin [10:No Abnormalities Noted] [2:No Abnormalities Noted] [4:No Abnormalities Noted] Moisture: Periwound Skin Color: No Abnormalities Noted [2:No Abnormalities Noted] [4:No Abnormalities Noted] Tenderness on [10:No] [2:No] [4:No] Wound Number: [id number]  Photos: No Photos No Photos No Photos Wound Location: Left, Medial Lower Leg Left Forearm Right Lower Leg Wounding Event: Trauma Trauma Trauma Primary Etiology: Trauma, Other Trauma, Other Trauma, Other Date Acquired: 02/24/2015 03/04/2015 03/08/2015 Weeks of Treatment: 4 3 2  Wound Status: Healed - Epithelialized Open Open Gottschall, Earvin Hansen ([id number]) Measurements L x W x D 0x0x0 1.3x1.7x0.1 2.2x1.5x0.1 (cm) Area (cm) : 0 1.736 2.592 Volume (cm) : 0 0.174 0.259 % Reduction in Area: 100.00% 70.00% -26.90% % Reduction in Volume: 100.00% 69.90% -27.00% Classification: Partial Thickness Partial Thickness Partial Thickness Periwound Skin Texture: No Abnormalities Noted No Abnormalities Noted No Abnormalities Noted Periwound Skin No Abnormalities Noted No Abnormalities Noted No Abnormalities Noted Moisture: Periwound Skin Color: No Abnormalities Noted No Abnormalities Noted No Abnormalities Noted Tenderness on No No No Palpation: Treatment Notes Electronic  Signature(s) Signed: 03/28/2015 3:20:42 PM By: Alexandria Roberts Entered By: Alexandria Roberts on 03/28/2015 13:59:13 Alexandria Roberts ([id number]) -------------------------------------------------------------------------------- Multi-Disciplinary Care Plan Details Patient Name: Alexandria Roberts Date of Service: 03/28/2015 1:00 PM Medical Record Number: [id number] Patient Account Number: [id number] Date of Birth/Sex: [birth year] (81 y.o. Female) Treating RN: Alexandria Roberts Primary Care Physician: Alexandria Roberts Other Clinician: Referring Physician: Nicky Roberts Treating Physician/Extender: Alexandria Roberts in Treatment: 17 Active Inactive Electronic Signature(s) Signed: 04/22/2015 11:14:23 AM By: Gretta Cool RN, BSN, Kim RN, BSN Signed: 05/21/2015 4:33:23 PM By: Alexandria Roberts Previous Signature: 03/28/2015 3:20:42 PM Version By: Alexandria Roberts Entered By: Gretta Cool RN, BSN, Kim on 04/22/2015 11:14:23 Alexandria Roberts ([id number]) -------------------------------------------------------------------------------- Patient/Caregiver Education Details Patient Name: Alexandria Roberts Date of Service: 03/28/2015 1:00 PM Medical Record Number: [id number] Patient Account Number: [id number] Date of Birth/Gender: 04/29/1924 (81 y.o. Female) Treating RN: Alexandria Roberts Primary Care Physician: Alexandria Roberts Other Clinician: Referring Physician: Nicky Roberts Treating Physician/Extender: Alexandria Roberts in Treatment: 83 Education Assessment Education Provided To: Patient and Caregiver Education Topics Provided Wound/Skin Impairment: Handouts: Caring for Your Ulcer, Skin Care Do's and Dont's Methods: Demonstration, Explain/Verbal  Responses: State content correctly Electronic Signature(s) Signed: 03/28/2015 3:05:43 PM By: Alexandria Roberts Entered By: Alexandria Roberts on 03/28/2015 15:05:43 Alexandria Roberts ([id number]) -------------------------------------------------------------------------------- Wound Assessment  Details Patient Name: Alexandria Roberts Date of Service: 03/28/2015 1:00 PM Medical Record Number: [id number] Patient Account Number: [id number] Date of Birth/Sex: [birth year] (81 y.o. Female) Treating RN: Alexandria Roberts Primary Care Physician: Alexandria Roberts Other Clinician: Referring Physician: Nicky Roberts Treating Physician/Extender: Alexandria Roberts in Treatment: 17 Wound Status Wound Number: 10 Primary Etiology: Skin Tear Wound Location: Right Elbow Wound Status: Open Wounding Event: Trauma Date Acquired: 03/26/2015 Weeks Of Treatment: 0 Clustered Wound: No Photos Photo Uploaded By: Gretta Cool, RN, BSN, Kim on 03/28/2015 16:00:45 Wound Measurements Length: (cm) 1.4 % Reduction Width: (cm) 0.5 % Reduction Depth: (cm) 0.1 Area: (cm) 0.55 Volume: (cm) 0.055 in Area: in Volume: Periwound Skin Texture Texture Color No Abnormalities Noted: No No Abnormalities Noted: No Moisture No Abnormalities Noted: No Electronic Signature(s) Signed: 03/28/2015 3:20:42 PM By: Alexandria Roberts Entered By: Alexandria Roberts on 03/28/2015 13:36:28 Alexandria Roberts ([id number]) -------------------------------------------------------------------------------- Wound Assessment Details Patient Name: Alexandria Roberts Date of Service: 03/28/2015 1:00 PM Medical Record Number: [id number] Patient Account Number: [id number] Date of Birth/Sex: 10-Mar-1924 (81 y.o. Female) Treating RN: Alexandria Roberts Primary Care Physician: Alexandria Roberts Other Clinician: Referring Physician: Nicky Roberts Treating Physician/Extender: Alexandria Roberts in Treatment: 17 Wound Status Wound Number: 2 Primary Etiology: Venous Leg Ulcer Wound Location: Left, Posterior Lower Leg Wound Status: Open Wounding Event: Blister Date Acquired: 10/07/2014 Weeks Of Treatment: 17 Clustered Wound: No Photos Photo Uploaded By: Gretta Cool, RN, BSN, Kim on 03/28/2015 16:01:10 Wound Measurements Length: (cm) 1.1 Width: (cm) 1 Depth: (cm)  0.2 Area: (cm) 0.864 Volume: (cm) 0.173 % Reduction in Area: 95.9% % Reduction in Volume: 97.2% Wound Description Classification: Partial Thickness Periwound Skin Texture Texture Color No Abnormalities Noted: No No Abnormalities Noted: No Moisture No Abnormalities Noted: No Electronic Signature(s) Signed: 03/28/2015 3:20:42 PM By: Quitman Livings, Earvin Hansen ([id number]) Entered By: Alexandria Roberts on 03/28/2015 13:29:39 Alexandria Roberts ([id number]) -------------------------------------------------------------------------------- Wound Assessment Details Patient Name: Alexandria Roberts Date of Service: 03/28/2015 1:00 PM Medical Record Number: [id number] Patient Account Number: [id number] Date of Birth/Sex: Dec 13, 1923 (81 y.o. Female) Treating RN: Alexandria Roberts Primary Care Physician: Alexandria Roberts Other Clinician: Referring Physician: Nicky Roberts Treating Physician/Extender: Alexandria Roberts in Treatment: 17 Wound Status Wound Number: 4 Primary Etiology: Arterial Insufficiency Ulcer Wound Location: Left, Lateral Lower Leg Wound Status: Open Wounding Event: Blister Date Acquired: 02/10/2015 Weeks Of Treatment: 6 Clustered Wound: No Photos Photo Uploaded By: Gretta Cool, RN, BSN, Kim on 03/28/2015 16:01:10 Wound Measurements Length: (cm) 1.1 Width: (cm) 0.2 Depth: (cm) 0.1 Area: (cm) 0.173 Volume: (cm) 0.017 % Reduction in Area: 8% % Reduction in Volume: 10.5% Wound Description Classification: Partial Thickness Periwound Skin Texture Texture Color No Abnormalities Noted: No No Abnormalities Noted: No Moisture No Abnormalities Noted: No Electronic Signature(s) Signed: 03/28/2015 3:20:42 PM By: Quitman Livings, Earvin Hansen ([id number]) Entered By: Alexandria Roberts on 03/28/2015 13:31:06 Alexandria Roberts ([id number]) -------------------------------------------------------------------------------- Wound Assessment Details Patient Name: Alexandria Roberts Date of  Service: 03/28/2015 1:00 PM Medical Record Number: [id number] Patient Account Number: [id number] Date of Birth/Sex: 13-Sep-1924 (81 y.o. Female) Treating RN: Alexandria Roberts Primary Care Physician: Alexandria Roberts Other Clinician: Referring Physician: Nicky Roberts Treating Physician/Extender: Alexandria Roberts in Treatment: 17 Wound Status Wound Number: 7 Primary Etiology: Trauma, Other Wound Location: Left, Medial Lower Leg Wound Status: Healed - Epithelialized Wounding Event: Trauma Date Acquired: 02/24/2015 Weeks Of Treatment: 4 Clustered Wound: No Photos  Photo Uploaded By: Gretta Cool, RN, BSN, Kim on 03/28/2015 16:01:53 Wound Measurements Length: (cm) 0 Width: (cm) 0 Depth: (cm) 0 Area: (cm) 0 Volume: (cm) 0 % Reduction in Area: 100% % Reduction in Volume: 100% Wound Description Classification: Partial Thickness Periwound Skin Texture Texture Color No Abnormalities Noted: No No Abnormalities Noted: No Moisture No Abnormalities Noted: No Electronic Signature(s) Signed: 03/28/2015 3:20:42 PM By: Quitman Livings, Earvin Hansen ([id number]) Entered By: Alexandria Roberts on 03/28/2015 13:32:21 Alexandria Roberts ([id number]) -------------------------------------------------------------------------------- Wound Assessment Details Patient Name: Alexandria Roberts Date of Service: 03/28/2015 1:00 PM Medical Record Number: [id number] Patient Account Number: [id number] Date of Birth/Sex: 11-Nov-1924 (81 y.o. Female) Treating RN: Alexandria Roberts Primary Care Physician: Alexandria Roberts Other Clinician: Referring Physician: Nicky Roberts Treating Physician/Extender: Alexandria Roberts in Treatment: 17 Wound Status Wound Number: 8 Primary Etiology: Trauma, Other Wound Location: Left Forearm Wound Status: Open Wounding Event: Trauma Date Acquired: 03/04/2015 Weeks Of Treatment: 3 Clustered Wound: No Photos Photo Uploaded By: Gretta Cool, RN, BSN, Kim on 03/28/2015 16:01:54 Wound  Measurements Length: (cm) 1.3 Width: (cm) 1.7 Depth: (cm) 0.1 Area: (cm) 1.736 Volume: (cm) 0.174 % Reduction in Area: 70% % Reduction in Volume: 69.9% Wound Description Classification: Partial Thickness Periwound Skin Texture Texture Color No Abnormalities Noted: No No Abnormalities Noted: No Moisture No Abnormalities Noted: No Electronic Signature(s) Signed: 03/28/2015 3:20:42 PM By: Quitman Livings, Earvin Hansen ([id number]) Entered By: Alexandria Roberts on 03/28/2015 13:34:17 Alexandria Roberts ([id number]) -------------------------------------------------------------------------------- Wound Assessment Details Patient Name: Alexandria Roberts Date of Service: 03/28/2015 1:00 PM Medical Record Number: [id number] Patient Account Number: [id number] Date of Birth/Sex: 1924/05/16 (81 y.o. Female) Treating RN: Alexandria Roberts Primary Care Physician: Alexandria Roberts Other Clinician: Referring Physician: Nicky Roberts Treating Physician/Extender: Alexandria Roberts in Treatment: 17 Wound Status Wound Number: 9 Primary Etiology: Trauma, Other Wound Location: Right Lower Leg Wound Status: Open Wounding Event: Trauma Date Acquired: 03/08/2015 Weeks Of Treatment: 2 Clustered Wound: No Photos Photo Uploaded By: Gretta Cool, RN, BSN, Kim on 03/28/2015 16:03:31 Wound Measurements Length: (cm) 2.2 Width: (cm) 1.5 Depth: (cm) 0.1 Area: (cm) 2.592 Volume: (cm) 0.259 % Reduction in Area: -26.9% % Reduction in Volume: -27% Wound Description Classification: Partial Thickness Periwound Skin Texture Texture Color No Abnormalities Noted: No No Abnormalities Noted: No Moisture No Abnormalities Noted: No Electronic Signature(s) Signed: 03/28/2015 3:20:42 PM By: Quitman Livings, Earvin Hansen ([id number]) Entered By: Alexandria Roberts on 03/28/2015 13:35:09 Alexandria Roberts ([id number]) -------------------------------------------------------------------------------- Vitals Details Patient Name:  Alexandria Roberts Date of Service: 03/28/2015 1:00 PM Medical Record Number: [id number] Patient Account Number: [id number] Date of Birth/Sex: [birth year] (81 y.o. Female) Treating RN: Alexandria Roberts Primary Care Physician: Alexandria Roberts Other Clinician: Referring Physician: Nicky Roberts Treating Physician/Extender: Alexandria Roberts in Treatment: 17 Vital Signs Time Taken: 13:12 Temperature (F): 97.2 Height (in): 64 Pulse (bpm): 76 Weight (lbs): 142 Respiratory Rate (breaths/min): 18 Body Mass Index (BMI): 24.4 Blood Pressure (mmHg): 77/54 Reference Range: 80 - 120 mg / dl Electronic Signature(s) Signed: 03/28/2015 3:20:42 PM By: Alexandria Roberts Entered By: Alexandria Roberts on 03/28/2015 13:13:20

## 2015-07-07 ENCOUNTER — Ambulatory Visit (INDEPENDENT_AMBULATORY_CARE_PROVIDER_SITE_OTHER): Payer: Medicare Other | Admitting: Podiatry

## 2015-07-07 DIAGNOSIS — M79606 Pain in leg, unspecified: Secondary | ICD-10-CM

## 2015-07-07 DIAGNOSIS — B351 Tinea unguium: Secondary | ICD-10-CM

## 2015-07-07 NOTE — Progress Notes (Signed)
He presents today with chief complaint of painful elongated toenails 1 through 5 bilateral.  Objective: Vital signs are stable alert and oriented 3. Nails thick yellow dystrophic, mycotic and painful palpation.  Assessment: Pain in limb secondary onychomycosis 1 through 5 bilateral. Hallux beds, esp. R, show maceration.  Plan: Debridement of nails 1 through 5 bilateral. 3 mo. Light dress applied to hallux nail beds. Epsom soaks daily prn.

## 2015-10-07 DEATH — deceased

## 2015-10-08 ENCOUNTER — Ambulatory Visit: Payer: Medicare Other

## 2015-10-08 ENCOUNTER — Ambulatory Visit: Payer: Medicare Other | Admitting: Podiatry

## 2017-01-19 IMAGING — CR RIGHT ELBOW - COMPLETE 3+ VIEW
1 series · 4 of 4 positions shown · non-contrast
Comparison: None.

CLINICAL DATA: Right elbow pain status post fall

EXAM:
RIGHT ELBOW - COMPLETE 3+ VIEW

[Series 1: dxr elbow rt comp w/obliques · 0.14mm/px · 4 of 4 slices shown]
[im 1/4]
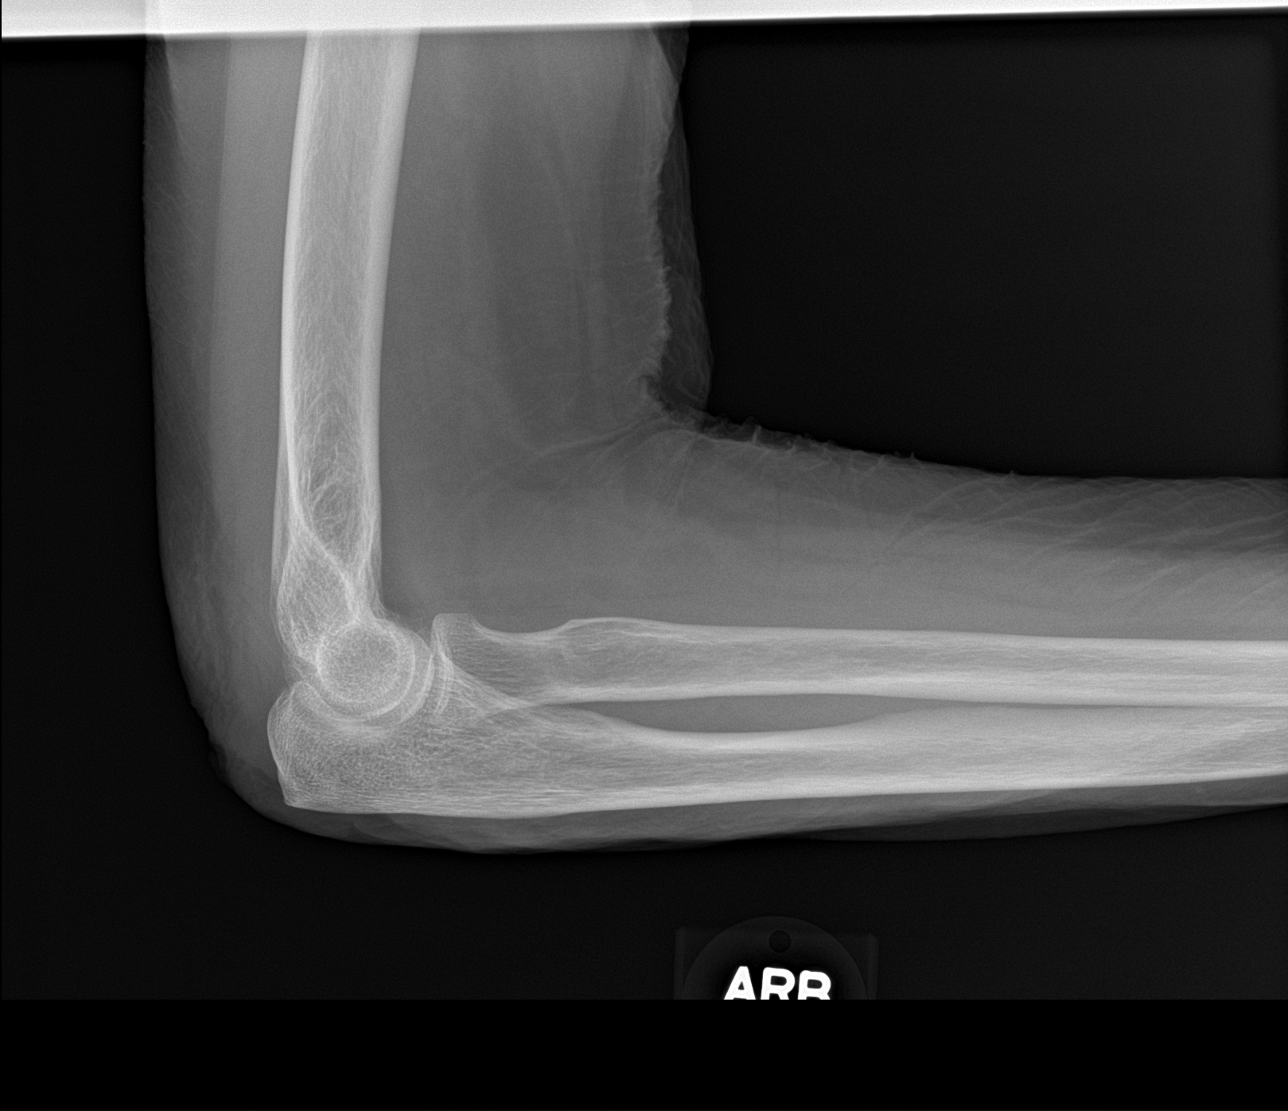
[im 2/4]
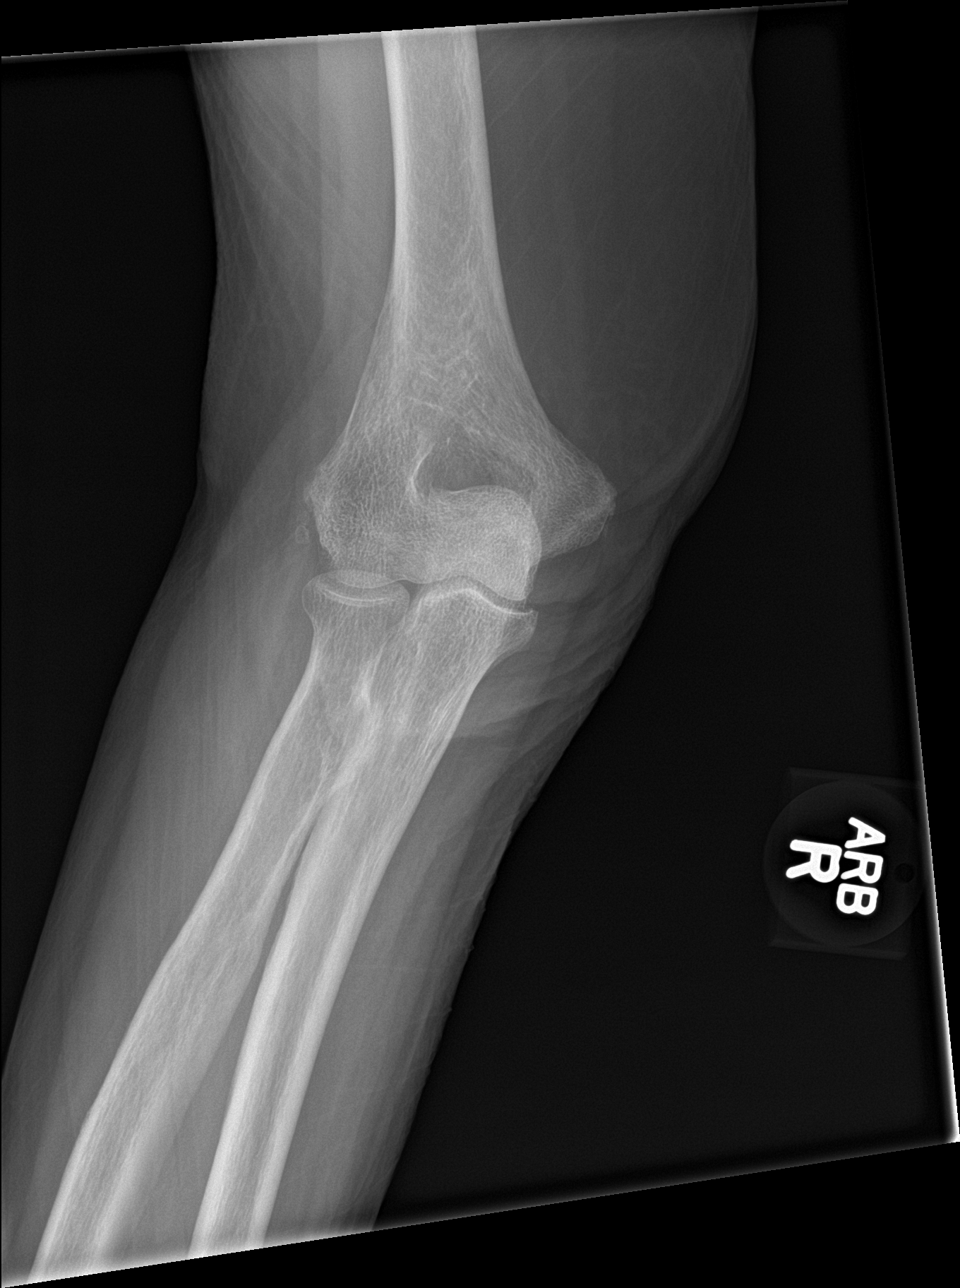
[im 3/4]
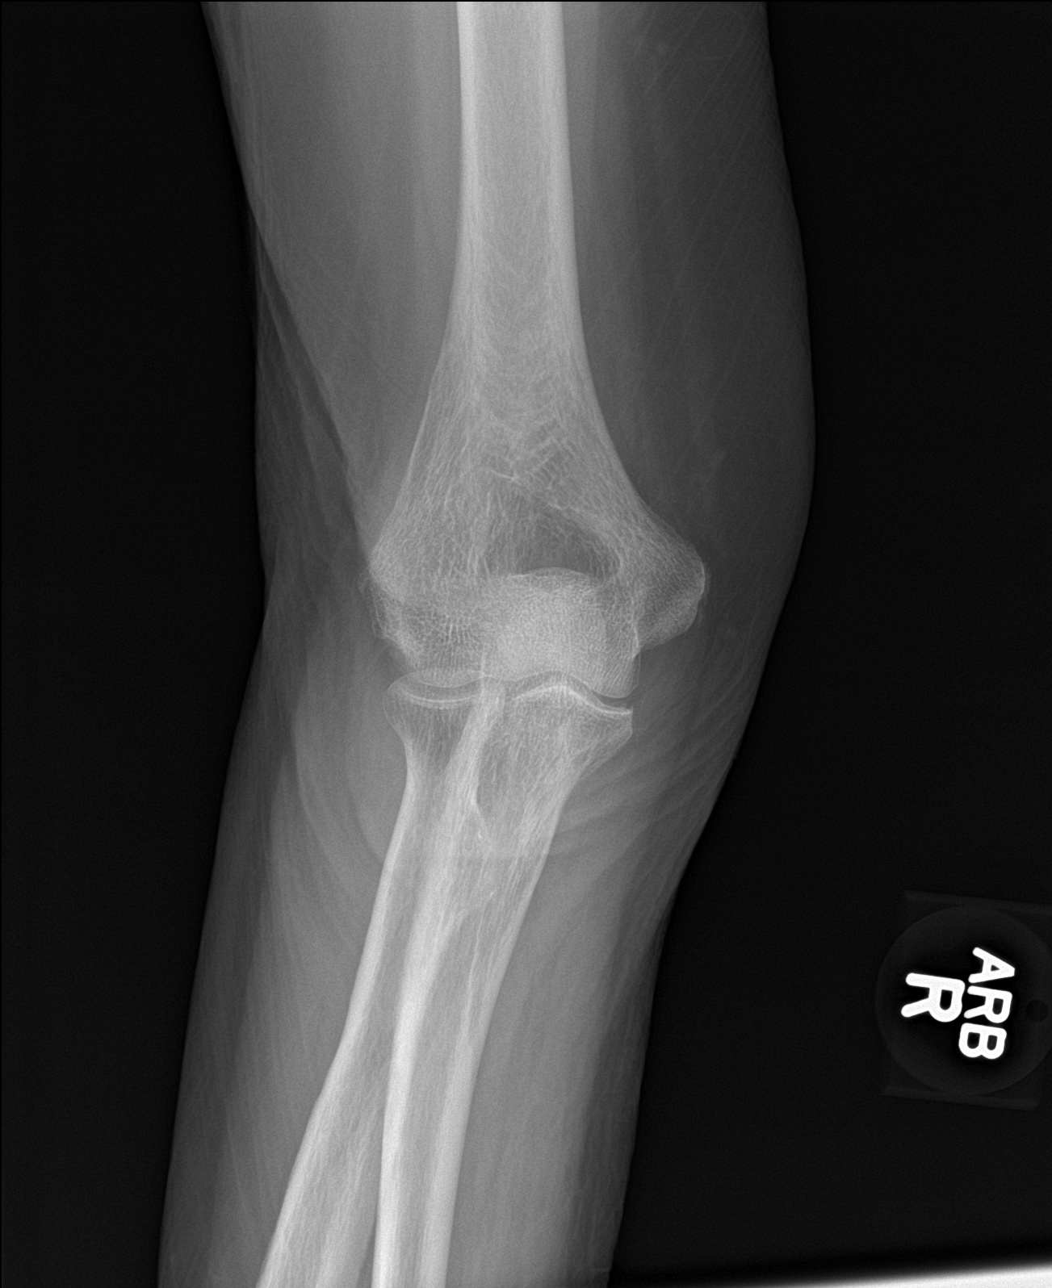
[im 4/4]
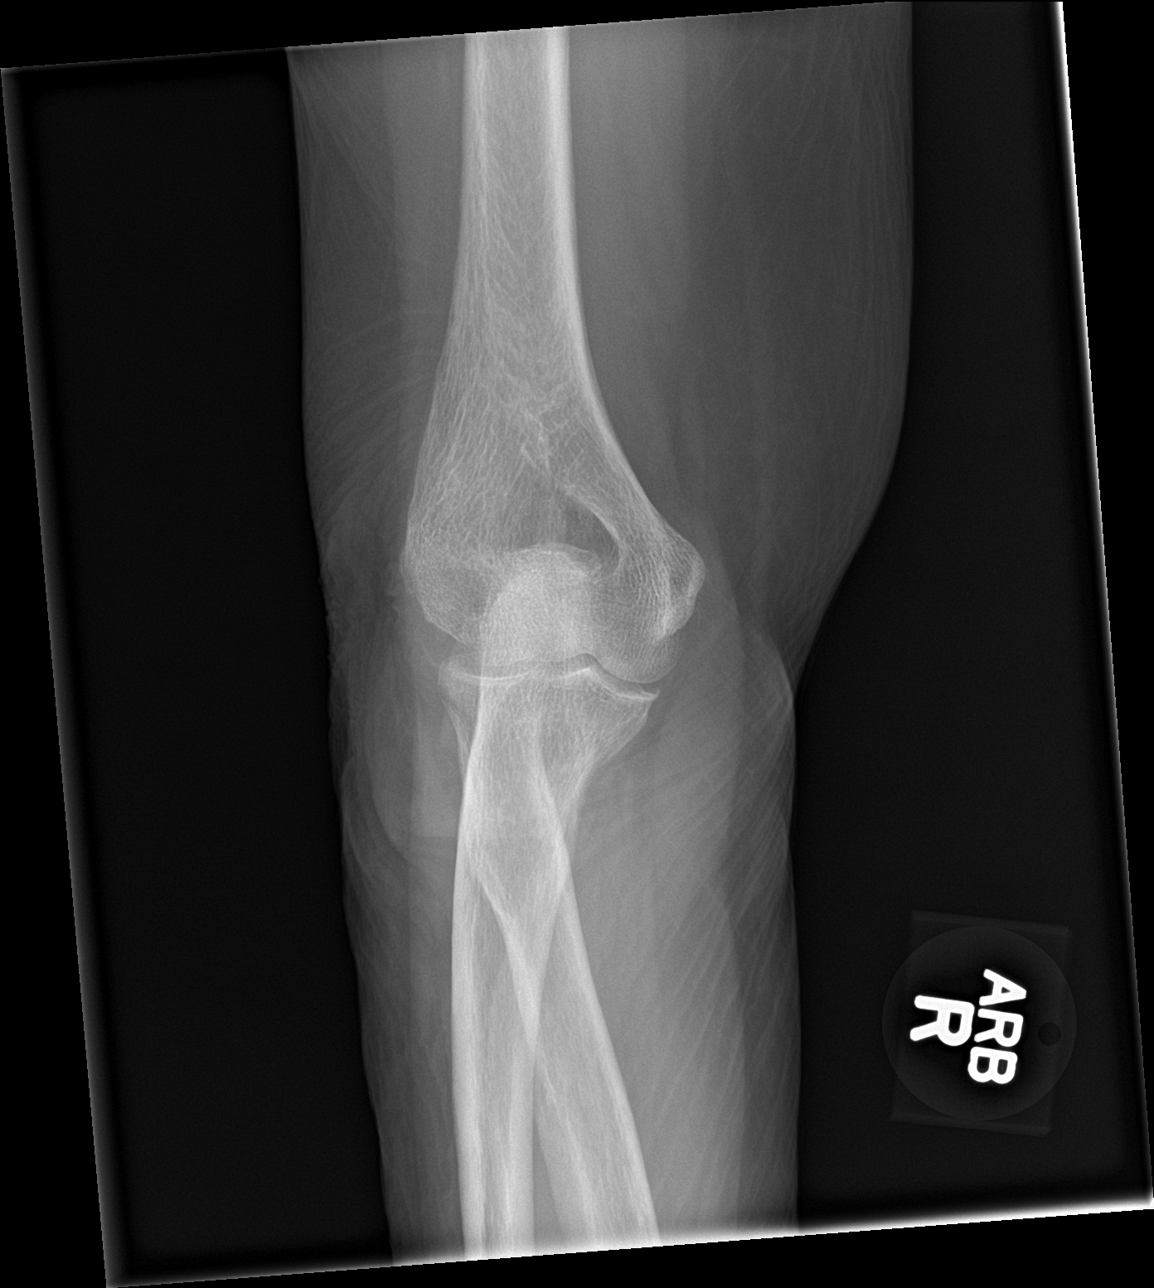

[4 of 4 positions shown; findings below may reference images not displayed]

FINDINGS: There is no evidence of fracture, dislocation, or joint effusion.
There is no evidence of arthropathy or other focal bone abnormality.
Soft tissue laceration over the olecranon process.
IMPRESSION: No acute osseous injury of the right elbow.

## 2017-03-10 IMAGING — US US EXTREM LOW VENOUS*L*
1 series · 14 of 24 positions shown · non-contrast
Comparison: None

CLINICAL DATA: Arterial stent placement [REDACTED]. Edema since
[REDACTED]. History of DVT ,breast carcinoma. On Plavix.

EXAM:
LEFT LOWER EXTREMITY VENOUS DOPPLER ULTRASOUND
TECHNIQUE: Gray-scale sonography with compression, as well as color and duplex
ultrasound, were performed to evaluate the deep venous system from
the level of the common femoral vein through the popliteal and
proximal calf veins.

[Series 1: us extrem low venous*left* · 0.08mm/px · 14 of 51 slices shown]
[im 1/51]
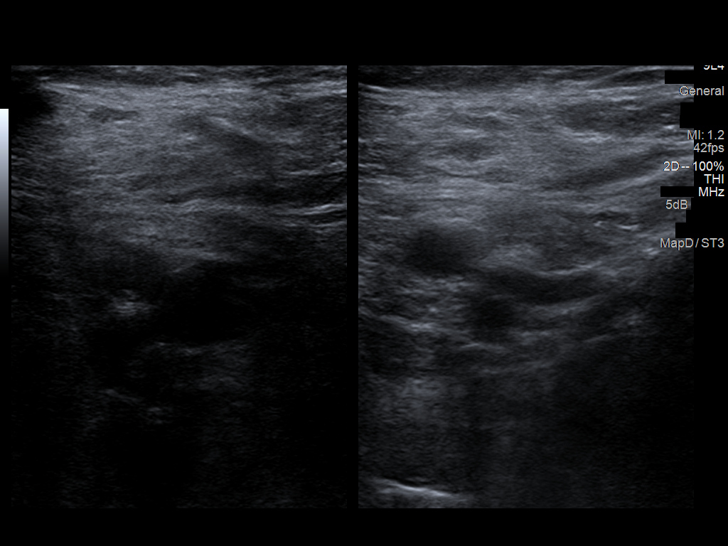
[im 5/51]
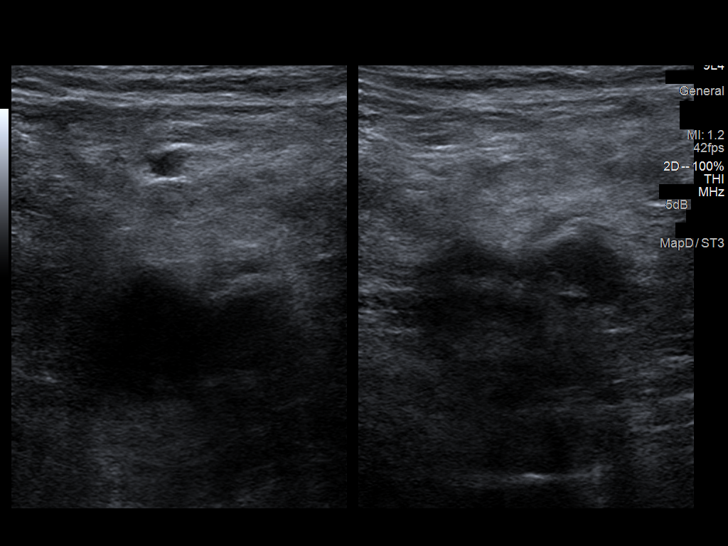
[im 9/51]
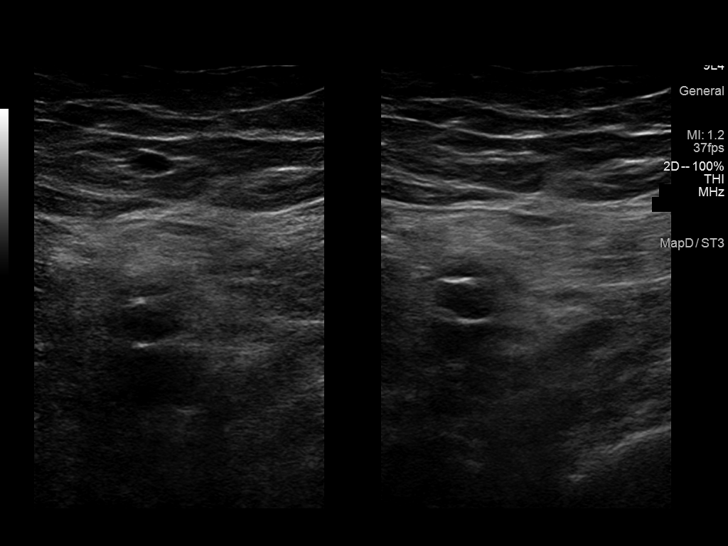
[im 14/51]
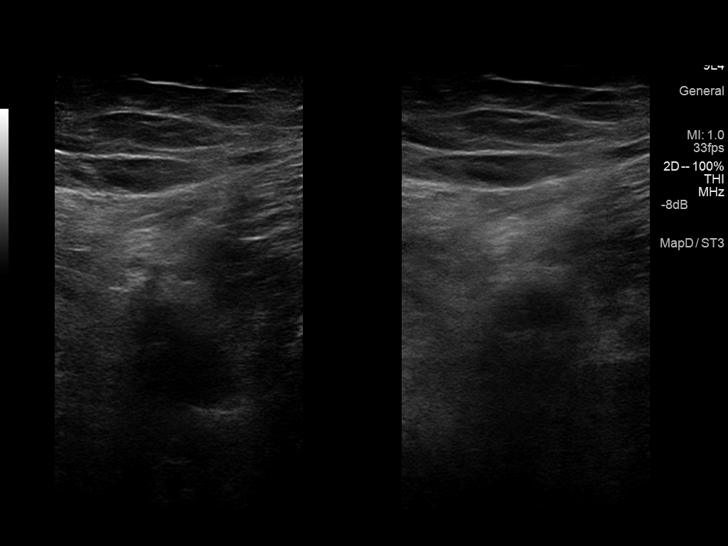
[im 16/51]
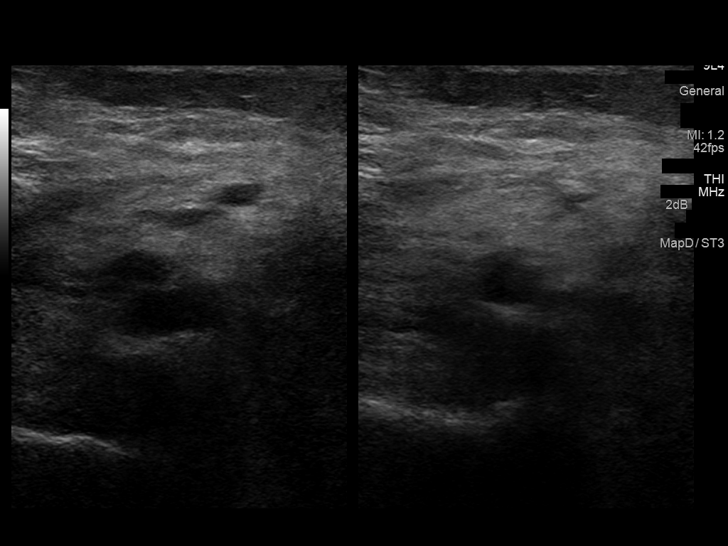
[im 20/51]
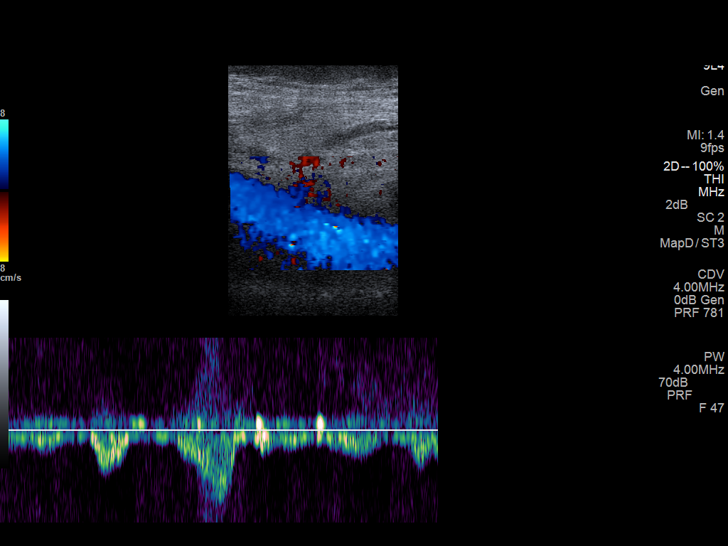
[im 24/51]
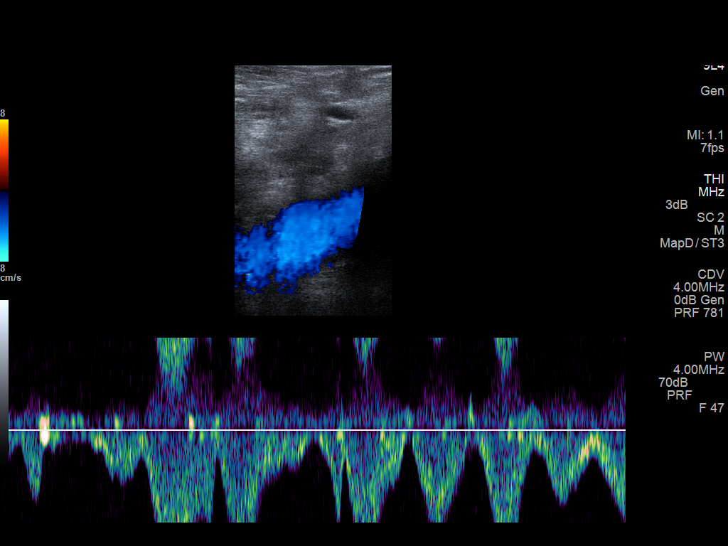
[im 27/51]
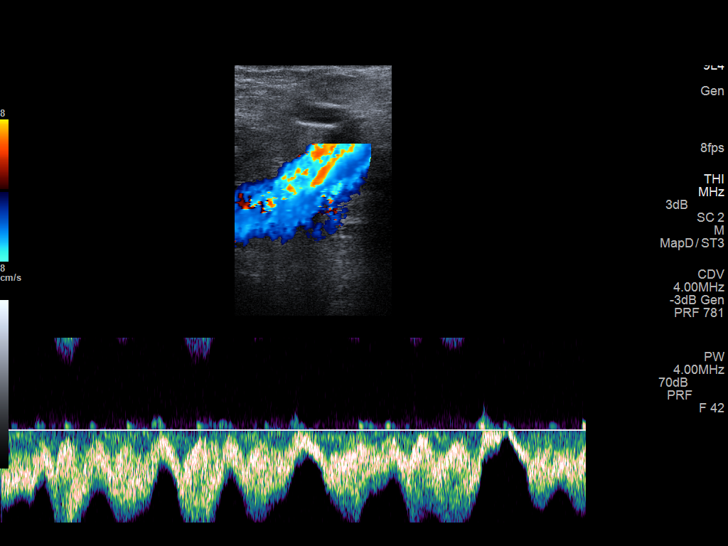
[im 31/51]
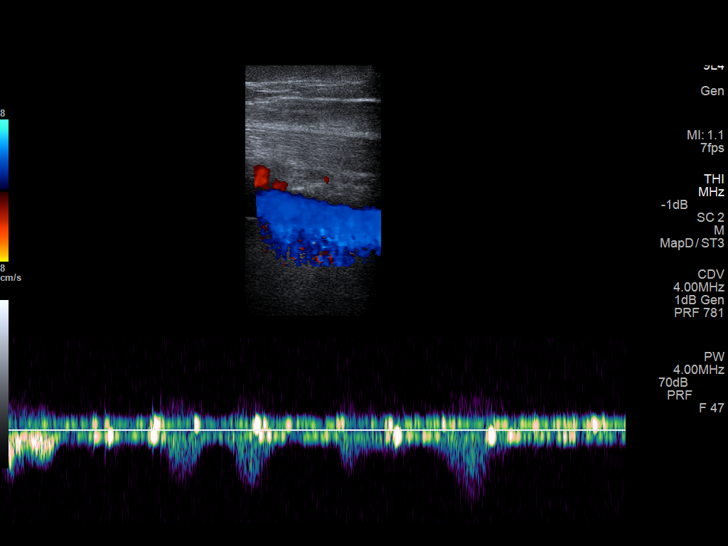
[im 35/51]
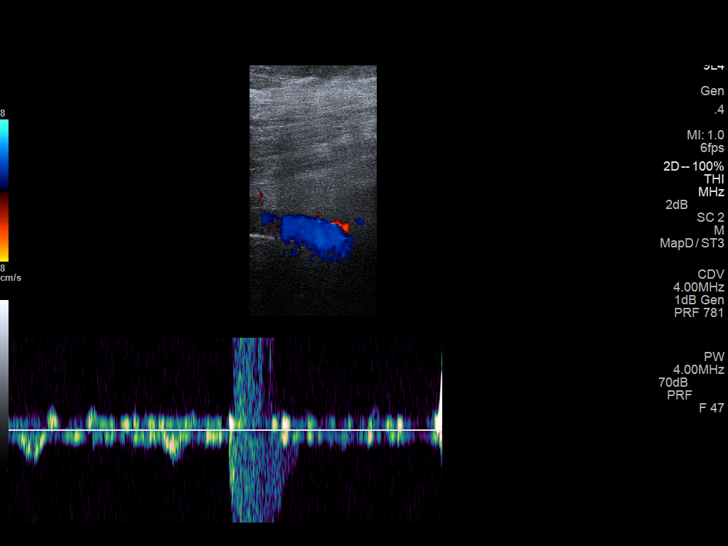
[im 40/51]
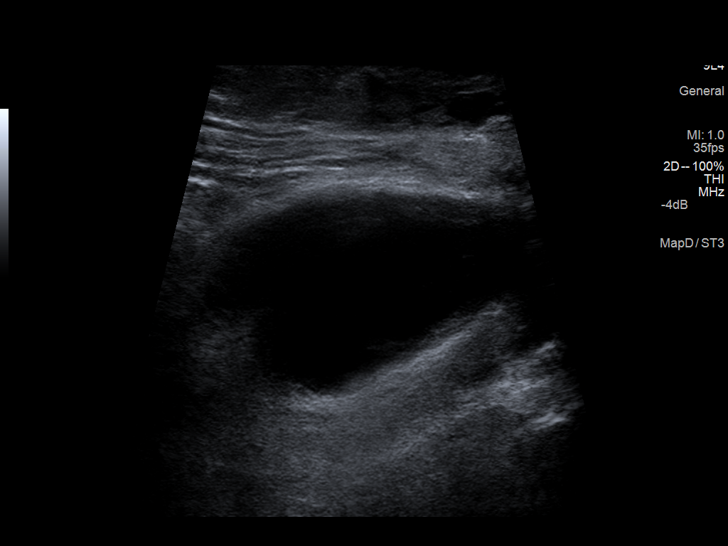
[im 42/51]
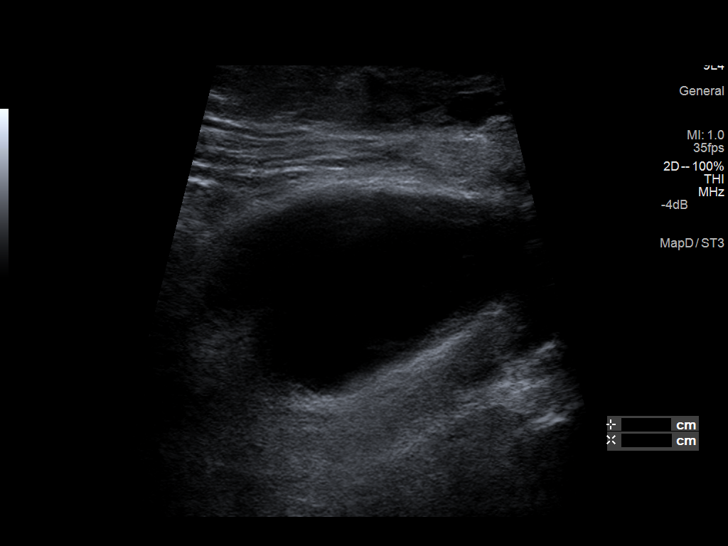
[im 46/51]
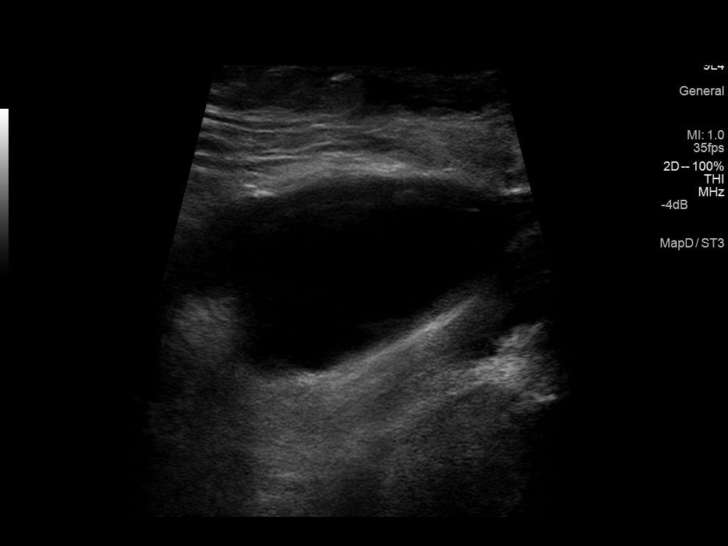
[im 51/51]
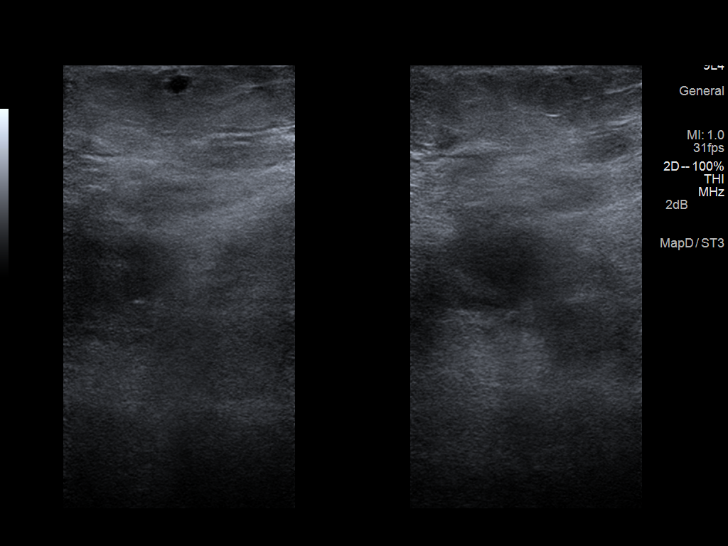

[14 of 24 positions shown; findings below may reference images not displayed]

FINDINGS: Normal compressibility of the common femoral, superficial femoral,
and popliteal veins, as well as the proximal calf veins. No filling
defects to suggest DVT on grayscale or color Doppler imaging.
Doppler waveforms show normal direction of venous flow, normal
respiratory phasicity and response to augmentation. There is a 46 x
24 x 35 mm fluid collection in the posterior popliteal fossa. Survey
views of the contralateral common femoral vein are unremarkable.
IMPRESSION: 1. No evidence of lower extremity deep vein thrombosis, LEFT.
2. Left Baker's cyst.
